# Patient Record
Sex: Female | Born: 1960 | Race: White | Hispanic: No | Smoking: Current every day smoker
Health system: Southern US, Community
[De-identification: ages and names within clinical notes are randomized; demographics above are authoritative.]

## PROBLEM LIST (undated history)

## (undated) DIAGNOSIS — F419 Anxiety disorder, unspecified: Secondary | ICD-10-CM

## (undated) DIAGNOSIS — I10 Essential (primary) hypertension: Secondary | ICD-10-CM

## (undated) DIAGNOSIS — B977 Papillomavirus as the cause of diseases classified elsewhere: Secondary | ICD-10-CM

## (undated) DIAGNOSIS — Z8619 Personal history of other infectious and parasitic diseases: Secondary | ICD-10-CM

## (undated) DIAGNOSIS — IMO0002 Reserved for concepts with insufficient information to code with codable children: Secondary | ICD-10-CM

## (undated) DIAGNOSIS — M1990 Unspecified osteoarthritis, unspecified site: Secondary | ICD-10-CM

## (undated) DIAGNOSIS — K219 Gastro-esophageal reflux disease without esophagitis: Secondary | ICD-10-CM

## (undated) HISTORY — PX: ANKLE FRACTURE SURGERY: SHX122

## (undated) HISTORY — DX: Unspecified osteoarthritis, unspecified site: M19.90

## (undated) HISTORY — PX: FOOT FRACTURE SURGERY: SHX645

## (undated) HISTORY — DX: Personal history of other infectious and parasitic diseases: Z86.19

## (undated) HISTORY — DX: Papillomavirus as the cause of diseases classified elsewhere: B97.7

## (undated) HISTORY — DX: Anxiety disorder, unspecified: F41.9

## (undated) HISTORY — DX: Essential (primary) hypertension: I10

## (undated) HISTORY — DX: Gastro-esophageal reflux disease without esophagitis: K21.9

## (undated) HISTORY — DX: Reserved for concepts with insufficient information to code with codable children: IMO0002

---

## 1980-01-09 HISTORY — PX: MANDIBLE OSTEOTOMY: SHX1007

## 1995-01-09 HISTORY — PX: BREAST BIOPSY: SHX20

## 1996-01-09 HISTORY — PX: CHOLECYSTECTOMY: SHX55

## 1999-03-29 ENCOUNTER — Other Ambulatory Visit: Admission: RE | Admit: 1999-03-29 | Discharge: 1999-03-29 | Payer: Self-pay | Admitting: Obstetrics and Gynecology

## 1999-12-08 ENCOUNTER — Emergency Department (HOSPITAL_COMMUNITY): Admission: EM | Admit: 1999-12-08 | Discharge: 1999-12-08 | Payer: Self-pay | Admitting: Emergency Medicine

## 2000-05-03 ENCOUNTER — Other Ambulatory Visit: Admission: RE | Admit: 2000-05-03 | Discharge: 2000-05-03 | Payer: Self-pay | Admitting: Obstetrics and Gynecology

## 2001-05-16 ENCOUNTER — Other Ambulatory Visit: Admission: RE | Admit: 2001-05-16 | Discharge: 2001-05-16 | Payer: Self-pay | Admitting: Obstetrics and Gynecology

## 2002-06-24 ENCOUNTER — Other Ambulatory Visit: Admission: RE | Admit: 2002-06-24 | Discharge: 2002-06-24 | Payer: Self-pay | Admitting: Obstetrics and Gynecology

## 2003-08-05 ENCOUNTER — Other Ambulatory Visit: Admission: RE | Admit: 2003-08-05 | Discharge: 2003-08-05 | Payer: Self-pay | Admitting: Obstetrics and Gynecology

## 2003-08-11 ENCOUNTER — Encounter: Admission: RE | Admit: 2003-08-11 | Discharge: 2003-08-11 | Payer: Self-pay | Admitting: Obstetrics and Gynecology

## 2004-08-25 ENCOUNTER — Ambulatory Visit: Payer: Self-pay | Admitting: Internal Medicine

## 2004-09-15 ENCOUNTER — Other Ambulatory Visit: Admission: RE | Admit: 2004-09-15 | Discharge: 2004-09-15 | Payer: Self-pay | Admitting: Obstetrics and Gynecology

## 2005-04-27 ENCOUNTER — Ambulatory Visit: Payer: Self-pay | Admitting: Licensed Clinical Social Worker

## 2005-05-02 ENCOUNTER — Ambulatory Visit: Payer: Self-pay | Admitting: Licensed Clinical Social Worker

## 2005-05-11 ENCOUNTER — Ambulatory Visit: Payer: Self-pay | Admitting: Licensed Clinical Social Worker

## 2005-06-01 ENCOUNTER — Ambulatory Visit: Payer: Self-pay | Admitting: Licensed Clinical Social Worker

## 2005-06-08 ENCOUNTER — Ambulatory Visit: Payer: Self-pay | Admitting: Licensed Clinical Social Worker

## 2005-06-18 ENCOUNTER — Ambulatory Visit: Payer: Self-pay | Admitting: Licensed Clinical Social Worker

## 2005-06-25 ENCOUNTER — Ambulatory Visit: Payer: Self-pay | Admitting: Internal Medicine

## 2005-09-10 ENCOUNTER — Encounter: Payer: Self-pay | Admitting: Internal Medicine

## 2005-11-02 ENCOUNTER — Ambulatory Visit: Payer: Self-pay | Admitting: Licensed Clinical Social Worker

## 2005-11-23 ENCOUNTER — Ambulatory Visit: Payer: Self-pay | Admitting: Licensed Clinical Social Worker

## 2005-12-10 ENCOUNTER — Encounter: Admission: RE | Admit: 2005-12-10 | Discharge: 2005-12-10 | Payer: Self-pay | Admitting: Obstetrics and Gynecology

## 2006-03-11 ENCOUNTER — Ambulatory Visit: Payer: Self-pay | Admitting: Internal Medicine

## 2006-07-09 ENCOUNTER — Encounter: Admission: RE | Admit: 2006-07-09 | Discharge: 2006-07-09 | Payer: Self-pay | Admitting: Obstetrics and Gynecology

## 2006-10-02 ENCOUNTER — Ambulatory Visit: Payer: Self-pay | Admitting: Internal Medicine

## 2006-10-02 ENCOUNTER — Encounter: Payer: Self-pay | Admitting: Internal Medicine

## 2006-10-02 DIAGNOSIS — D239 Other benign neoplasm of skin, unspecified: Secondary | ICD-10-CM | POA: Insufficient documentation

## 2006-10-03 DIAGNOSIS — F419 Anxiety disorder, unspecified: Secondary | ICD-10-CM

## 2006-10-03 DIAGNOSIS — F329 Major depressive disorder, single episode, unspecified: Secondary | ICD-10-CM

## 2006-10-03 DIAGNOSIS — M545 Low back pain, unspecified: Secondary | ICD-10-CM | POA: Insufficient documentation

## 2006-11-04 ENCOUNTER — Encounter: Payer: Self-pay | Admitting: Internal Medicine

## 2006-11-04 ENCOUNTER — Ambulatory Visit: Payer: Self-pay | Admitting: Internal Medicine

## 2006-11-11 ENCOUNTER — Ambulatory Visit: Payer: Self-pay | Admitting: Internal Medicine

## 2006-11-12 ENCOUNTER — Encounter: Payer: Self-pay | Admitting: Internal Medicine

## 2006-11-15 ENCOUNTER — Ambulatory Visit: Payer: Self-pay | Admitting: Internal Medicine

## 2006-11-21 ENCOUNTER — Ambulatory Visit: Payer: Self-pay | Admitting: Internal Medicine

## 2006-11-21 ENCOUNTER — Encounter (INDEPENDENT_AMBULATORY_CARE_PROVIDER_SITE_OTHER): Payer: Self-pay | Admitting: *Deleted

## 2007-01-31 ENCOUNTER — Ambulatory Visit: Payer: Self-pay | Admitting: Psychology

## 2007-02-04 ENCOUNTER — Encounter: Admission: RE | Admit: 2007-02-04 | Discharge: 2007-02-04 | Payer: Self-pay | Admitting: Obstetrics and Gynecology

## 2007-02-13 ENCOUNTER — Ambulatory Visit: Payer: Self-pay | Admitting: Psychology

## 2007-02-21 ENCOUNTER — Ambulatory Visit: Payer: Self-pay | Admitting: Psychology

## 2007-03-14 ENCOUNTER — Ambulatory Visit: Payer: Self-pay | Admitting: Psychology

## 2007-03-28 ENCOUNTER — Ambulatory Visit: Payer: Self-pay | Admitting: Psychology

## 2007-04-18 ENCOUNTER — Ambulatory Visit: Payer: Self-pay | Admitting: Psychology

## 2007-05-16 ENCOUNTER — Ambulatory Visit: Payer: Self-pay | Admitting: Psychology

## 2007-06-24 ENCOUNTER — Ambulatory Visit: Payer: Self-pay | Admitting: Psychology

## 2007-07-08 ENCOUNTER — Telehealth: Payer: Self-pay | Admitting: Internal Medicine

## 2007-08-04 ENCOUNTER — Ambulatory Visit: Payer: Self-pay | Admitting: Psychology

## 2007-09-02 ENCOUNTER — Ambulatory Visit: Payer: Self-pay | Admitting: Psychology

## 2007-09-26 ENCOUNTER — Ambulatory Visit: Payer: Self-pay | Admitting: Psychology

## 2007-10-24 ENCOUNTER — Ambulatory Visit: Payer: Self-pay | Admitting: Psychology

## 2007-11-21 ENCOUNTER — Ambulatory Visit: Payer: Self-pay | Admitting: Psychology

## 2007-12-10 ENCOUNTER — Telehealth: Payer: Self-pay | Admitting: Internal Medicine

## 2008-01-16 ENCOUNTER — Ambulatory Visit: Payer: Self-pay | Admitting: Psychology

## 2008-04-08 ENCOUNTER — Ambulatory Visit: Payer: Self-pay | Admitting: Psychology

## 2008-06-04 ENCOUNTER — Ambulatory Visit: Payer: Self-pay | Admitting: Psychology

## 2008-07-09 ENCOUNTER — Telehealth: Payer: Self-pay | Admitting: Internal Medicine

## 2008-08-03 ENCOUNTER — Ambulatory Visit: Payer: Self-pay | Admitting: Psychology

## 2009-03-15 ENCOUNTER — Telehealth: Payer: Self-pay | Admitting: Internal Medicine

## 2009-04-08 ENCOUNTER — Encounter: Payer: Self-pay | Admitting: Internal Medicine

## 2009-04-08 LAB — CONVERTED CEMR LAB: Pap Smear: NORMAL

## 2009-06-27 ENCOUNTER — Telehealth: Payer: Self-pay | Admitting: Internal Medicine

## 2009-06-29 ENCOUNTER — Ambulatory Visit: Payer: Self-pay | Admitting: Internal Medicine

## 2009-07-14 ENCOUNTER — Ambulatory Visit: Payer: Self-pay | Admitting: Psychology

## 2009-07-19 ENCOUNTER — Ambulatory Visit: Payer: Self-pay | Admitting: Internal Medicine

## 2009-07-19 DIAGNOSIS — F458 Other somatoform disorders: Secondary | ICD-10-CM

## 2009-07-25 ENCOUNTER — Encounter: Payer: Self-pay | Admitting: Internal Medicine

## 2009-07-28 ENCOUNTER — Ambulatory Visit: Payer: Self-pay | Admitting: Psychology

## 2009-08-12 ENCOUNTER — Ambulatory Visit: Payer: Self-pay | Admitting: Internal Medicine

## 2009-08-12 ENCOUNTER — Ambulatory Visit: Payer: Self-pay | Admitting: Psychology

## 2009-08-12 LAB — CONVERTED CEMR LAB
ALT: 12 units/L (ref 0–35)
AST: 16 units/L (ref 0–37)
Alkaline Phosphatase: 42 units/L (ref 39–117)
Basophils Absolute: 0 10*3/uL (ref 0.0–0.1)
Bilirubin Urine: NEGATIVE
Bilirubin, Direct: 0.1 mg/dL (ref 0.0–0.3)
CO2: 30 meq/L (ref 19–32)
Calcium: 9.1 mg/dL (ref 8.4–10.5)
Chloride: 106 meq/L (ref 96–112)
Eosinophils Relative: 1.9 % (ref 0.0–5.0)
Glucose, Bld: 81 mg/dL (ref 70–99)
HCT: 38.5 % (ref 36.0–46.0)
HDL: 70.5 mg/dL (ref >39.00)
Ketones, ur: NEGATIVE mg/dL
LDL Cholesterol: 102 mg/dL — ABNORMAL HIGH (ref 0–99)
Leukocytes, UA: NEGATIVE
Lymphocytes Relative: 26.5 % (ref 12.0–46.0)
Monocytes Relative: 6.4 % (ref 3.0–12.0)
Neutrophils Relative %: 64.5 % (ref 43.0–77.0)
Platelets: 226 10*3/uL (ref 150.0–400.0)
RDW: 12.4 % (ref 11.5–14.6)
Sodium: 141 meq/L (ref 135–145)
Specific Gravity, Urine: 1.02 (ref 1.000–1.030)
Total Bilirubin: 0.8 mg/dL (ref 0.3–1.2)
Total CHOL/HDL Ratio: 3
Total Protein, Urine: NEGATIVE mg/dL
Vitamin B-12: 159 pg/mL — ABNORMAL LOW (ref 211–911)
WBC: 4.8 10*3/uL (ref 4.5–10.5)
pH: 7 (ref 5.0–8.0)

## 2009-08-18 ENCOUNTER — Ambulatory Visit: Payer: Self-pay | Admitting: Internal Medicine

## 2009-08-18 DIAGNOSIS — E538 Deficiency of other specified B group vitamins: Secondary | ICD-10-CM | POA: Insufficient documentation

## 2009-12-30 ENCOUNTER — Telehealth: Payer: Self-pay | Admitting: Internal Medicine

## 2010-02-08 ENCOUNTER — Telehealth: Payer: Self-pay | Admitting: Internal Medicine

## 2010-02-09 NOTE — Progress Notes (Signed)
  Phone Note Refill Request Message from:  Fax from Pharmacy on March 15, 2009 4:07 PM  Refills Requested: Medication #1:  ALPRAZOLAM 0.25 MG  TABS q 6 as needed [facility] [address], please Advise refill  Initial call taken by: Ami Bullins CMA,  March 15, 2009 4:07 PM  Follow-up for Phone Call        I dont feel comfortable with full rx as this pt has not been seen in the office since nov 2008  ok for limited rx only;  please make return OV with Dr Debby Bud for further refills Follow-up by: Corwin Levins MD,  March 15, 2009 5:03 PM  Additional Follow-up for Phone Call Additional follow up Details #1::        rx faxed to pharmacy Additional Follow-up by: Margaret Pyle, CMA,  March 16, 2009 8:07 AM    New/Updated Medications: ALPRAZOLAM 0.25 MG  TABS (ALPRAZOLAM) q 6 as needed Prescriptions: ALPRAZOLAM 0.25 MG  TABS (ALPRAZOLAM) q 6 as needed  #40 x 0   Entered and Authorized by:   Corwin Levins MD   Signed by:   Corwin Levins MD on 03/15/2009   Method used:   Print then Give to Patient   RxID:   [id number]

## 2010-02-09 NOTE — Assessment & Plan Note (Signed)
Summary: CPX /NWS  #   Vital Signs:  Patient profile:   50 year old female Height:      65 inches (165.10 cm) Weight:      132.50 pounds (60.23 kg) BMI:     22.13 O2 Sat:      98 % on Room air Temp:     98.7 degrees F (37.06 degrees C) oral Pulse rate:   90 / minute BP sitting:   104 / 72  (left arm) Cuff size:   regular  Vitals Entered By: Anna Crawford CMA (August 18, 2009 1:47 PM)  O2 Flow:  Room air CC: CPX./kb Is Patient Diabetic? No Pain Assessment Patient in pain? no      Comments No refills needed at this time./kb   Primary Care Provider:  Norins  CC:  CPX./kb.  History of Present Illness: Anna Crawford presents for a general wellness exam. She was recently seen for situational anxiety and reports that she has seen an improvement with medication. She has tolerated it well. She has ha several productive sessions with Dr. Dellia Cloud.  She was having a problem with bruxism at her last visit and was started on cyclobenzaprine. She reports that she is really sleeping well which helps the "clenching" and also may contribute to the improvement in mood.  In the Interval she did have colonoscopy July 18th with no significant polyps. She is for repeat exam in 5 years.  She has seen her Gyn and is current with PAP testing and with breast exams/mammography. Reports not available.  Preventive Screening-Counseling & Management  Alcohol-Tobacco     Smoking Status: never  Caffeine-Diet-Exercise     Diet Comments: healthy diet     Does Patient Exercise: yes     Exercise (avg: min/session): intermittent  Hep-HIV-STD-Contraception     SBE monthly: yes  Safety-Violence-Falls     Seat Belt Use: yes     Fall Risk: none  Current Medications (verified): 1)  Vitamin B Complex   Caps (B Complex Vitamins) .... Once Daily 2)  Valtrex 500 Mg  Tabs (Valacyclovir Hcl) .... As Needed Fever Sores 3)  Alprazolam 0.25 Mg  Tabs (Alprazolam) .... Q 6 As Needed 4)  Ibuprofen 200 Mg  Caps  (Ibuprofen) .Marland Kitchen.. 1 or 2 Q6 As Needed 5)  Calcium 700mg  + Vitamin A, D, & K .... 1 By Mouth Once Daily 6)  Paxil Cr 12.5 Mg Xr24h-Tab (Paroxetine Hcl) .Marland Kitchen.. 1 By Mouth Once Daily 7)  Cyclobenzaprine Hcl 5 Mg Tabs (Cyclobenzaprine Hcl) .Marland Kitchen.. 1 By Mouth At Bedtime.  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  Physical Exam  General:  Well-developed,well-nourished,in no acute distress; alert,appropriate and cooperative throughout examination Head:  Normocephalic and atraumatic without obvious abnormalities. No apparent alopecia or balding. Eyes:  No corneal or conjunctival inflammation noted. EOMI. Perrla. Funduscopic exam benign, without hemorrhages, exudates or papilledema. Vision grossly normal. Ears:  External ear exam shows no significant lesions or deformities.  Otoscopic examination reveals clear canals, tympanic membranes are intact bilaterally  without bulging, retraction, inflammation or discharge. Hearing is grossly normal bilaterally. Nose:  no external deformity and no external erythema.   Mouth:  Oral mucosa and oropharynx without lesions or exudates.  Teeth in good repair. Neck:  supple, full ROM, no thyromegaly, and no carotid bruits.   Chest Wall:  No deformities, masses, or tenderness noted. Breasts:  deferred to gyn Lungs:  Normal respiratory effort, chest expands symmetrically. Lungs are clear to auscultation, no crackles or wheezes. Heart:  Normal rate and regular rhythm. S1 and S2 normal without gallop, murmur, click, rub or other extra sounds. Abdomen:  soft, non-tender, normal bowel sounds, no guarding, and no hepatomegaly.   Genitalia:  deferred to gyn Msk:  No deformity or scoliosis noted of thoracic or lumbar spine.   Pulses:  2+ radial and DP pulses Extremities:  No clubbing, cyanosis, edema, or deformity noted with normal full range of motion of all joints.   Neurologic:  alert & oriented X3, cranial nerves II-XII intact, strength normal in all extremities, gait normal, and DTRs symmetrical and normal.  No paresthesia noted. Skin:  turgor normal, color normal, no suspicious lesions, and no ulcerations.  Well healed scar medial left ankle from ORIF Cervical Nodes:  no anterior cervical adenopathy and no posterior cervical adenopathy.   Psych:  Oriented X3, normally interactive, and good eye contact.     Impression & Recommendations:  Problem # 1:  VITAMIN B12 DEFICIENCY (ICD-266.2) Serum B12 level 159. Discussed replacement options: monthly injections vs nasal spray.   Plan - Nascobal 1 spray to nostril once a week.            F/u B12 level in 6 months  Problem # 2:  BRUXISM (ICD-306.8) Doing better with flexeril. Dentist was to have fit her with a bite block.  Problem # 3:  ANXIETY (ICD-300.00) Doing very well on present regimen.   Plan - continue Paxil for 6 months and reassess.  Her updated  medication list for this problem includes:    Alprazolam 0.25 Mg Tabs (Alprazolam) ..... Q 6 as needed    Paxil Cr 12.5 Mg Xr24h-tab (Paroxetine hcl) .Marland Kitchen... 1 by mouth once daily  Problem # 4:  Preventive Health Care (ICD-V70.0) Inteval history as noted. Physical exam is normal. lab results at normal with an excellent lipid profile with a Framingham Data based 10 year cardiac risk of less than 1%. Serum glucose was normal. She is current with her gynecologist. She is current with colorectal cancer screening. She is a candidate for tetnus booster if she hasn't had one in last 10 years.  She is advised that aside from routine gynecologic exams she doesn't need a routine physical or lab work for at least 3 years.  In summary - a delightful woman who appears to be medically stable and generally in good health. She will return in 5-6 months for a med check and follow-up of B12 deficiency.   Complete Medication List: 1)  Vitamin B Complex Caps (B complex vitamins) .... Once daily 2)  Valtrex 500 Mg Tabs (Valacyclovir hcl) .... As  needed fever sores 3)  Alprazolam 0.25 Mg Tabs (Alprazolam) .... Q 6 as needed 4)  Ibuprofen 200 Mg Caps (Ibuprofen) .Marland Kitchen.. 1 or 2 q6 as needed 5)  Calcium 700mg  + Vitamin A, D, & K  .... 1 by mouth once daily 6)  Paxil Cr 12.5 Mg Xr24h-tab (Paroxetine hcl) .Marland Kitchen.. 1 by mouth once daily 7)  Cyclobenzaprine Hcl 5 Mg Tabs (Cyclobenzaprine hcl) .Marland Kitchen.. 1 by mouth at bedtime. 8)  Nascobal 500 Mcg/0.44ml Soln (Cyanocobalamin) .Marland Kitchen.. 1 spray to one nostril once a week   Patient: Anna Crawford Note: All result statuses are Final unless otherwise noted.  Tests: (1) BMP (METABOL)   Sodium                    141 mEq/L                   135-145   Potassium                 4.6 mEq/L                   3.5-5.1   Chloride                  106 mEq/L                   96-112   Carbon Dioxide            30 mEq/L                    19-32   Glucose                   81 mg/dL                     16-10   BUN                       11 mg/dL                    9-60   Creatinine                0.6 mg/dL                   4.5-4.0   Calcium                   9.1 mg/dL                   9.8-11.9   GFR                       124.20 mL/min               >60  Tests: (2) Lipid Panel (LIPID)   Cholesterol               184 mg/dL                   1-478     ATP III Classification            Desirable:  < 200 mg/dL                    Borderline High:  200 - 239 mg/dL               High:  > = 240 mg/dL  Triglycerides             58.0 mg/dL                  4.7-829.5     Normal:  <150 mg/dL     Borderline High:  621 - 199 mg/dL   HDL                       30.86 mg/dL                 >57.84   VLDL Cholesterol          11.6 mg/dL                  6.9-62.9   LDL Cholesterol      [H]  528 mg/dL                   4-13  CHO/HDL Ratio:  CHD Risk                             3                    Men          Women     1/2 Average Risk     3.4          3.3     Average Risk          5.0          4.4     2X Average Risk          9.6          7.1     3X Average Risk          15.0          11.0                           Tests: (3) CBC Platelet w/Diff (CBCD)   White Cell Count          4.8 K/uL                    4.5-10.5   Red Cell Count            3.93 Mil/uL                 3.87-5.11   Hemoglobin                13.4 g/dL                   24.4-01.0   Hematocrit                38.5 %                      36.0-46.0   MCV                       97.9 fl                     78.0-100.0   MCHC                      34.8 g/dL  30.0-36.0   RDW                       12.4 %                      11.5-14.6   Platelet Count            226.0 K/uL                  150.0-400.0   Neutrophil %              64.5 %                      43.0-77.0   Lymphocyte %              26.5 %                      12.0-46.0   Monocyte %                6.4 %                       3.0-12.0   Eosinophils%               1.9 %                       0.0-5.0   Basophils %               0.7 %                       0.0-3.0   Neutrophill Absolute      3.1 K/uL                    1.4-7.7   Lymphocyte Absolute       1.3 K/uL                    0.7-4.0   Monocyte Absolute         0.3 K/uL                    0.1-1.0  Eosinophils, Absolute                             0.1 K/uL                    0.0-0.7   Basophils Absolute        0.0 K/uL                    0.0-0.1  Tests: (4) Hepatic/Liver Function Panel (HEPATIC)   Total Bilirubin           0.8 mg/dL                   1.6-1.0   Direct Bilirubin          0.1 mg/dL                   9.6-0.4   Alkaline Phosphatase      42 U/L                      39-117   AST  16 U/L                      0-37   ALT                       12 U/L                      0-35   Total Protein             6.6 g/dL                    8.1-1.9   Albumin                   4.1 g/dL                    1.4-7.8  Tests: (5) TSH (TSH)   FastTSH                   1.24 uIU/mL                 0.35-5.50  Tests: (6) UDip Only (UDIP)   Color                     YELLOW       RANGE:  Yellow;Lt. Yellow   Clarity                   CLEAR                       Clear   Specific Gravity          1.020                       1.000 - 1.030   Urine Ph                  7.0                         5.0-8.0   Protein                   NEGATIVE                    Negative   Urine Glucose             NEGATIVE                    Negative   Ketones                   NEGATIVE                    Negative   Urine Bilirubin           NEGATIVE                    Negative   Blood                     NEGATIVE                    Negative   Urobilinogen              0.2  0.0 - 1.0   Leukocyte Esterace        NEGATIVE                    Negative   Nitrite                   NEGATIVE                    Negative  Tests: (7) T4, Free (FT4R)   Free T4                   0.78 ng/dL                   0.60-1.60  Tests: (8) B12 Serum - Total ONLY (B12)   Vitamin B12          [L]  159 pg/mL                   211-911Prescriptions: NASCOBAL 500 MCG/0.1ML SOLN (CYANOCOBALAMIN) 1 spray to one nostril once a week  #2.6ml x 2   Entered and Authorized by:   Jacques Navy MD   Signed by:   Jacques Navy MD on 08/18/2009   Method used:   [facility]        [phone]       [address]       [place], [place]  [address]       Ph: [phone]       Fax: [phone]   RxID:   [id number]   Preventive Care Screening  Colonoscopy:    Date:  07/25/2009    Results:  normal     Preventive Care Screening  Colonoscopy:    Date:  07/25/2009    Results:  normal

## 2010-02-09 NOTE — Assessment & Plan Note (Signed)
Summary: FU ON MEDS/NWS   #   Vital Signs:  Patient profile:   50 year old female Height:      65 inches Weight:      129 pounds BMI:     21.54 O2 Sat:      98 % on Room air Temp:     98.4 degrees F oral Pulse rate:   76 / minute BP sitting:   128 / 80  (left arm) Cuff size:   regular  Vitals Entered By: Bill Salinas CMA (July 19, 2009 10:47 AM)  O2 Flow:  Room air CC: pt here for f/u on Paxil/ ab   Primary Care Provider:  Lonni Dirden  CC:  pt here for f/u on Paxil/ ab.  History of Present Illness: medication follow-up. She is doing better with paxil CR: smoother and calmer, fewer ups and downs. She has seen Dr. Dellia Cloud, which was helpful, and will be seeing him every two weeks for a while.   She does report that she is clenching at night and subsequently having tension and discomfort in the neck. Her dentist has seens signs of bruxism.   Current Medications (verified): 1)  Vitamin B Complex   Caps (B Complex Vitamins) .... Once Daily 2)  Valtrex 500 Mg  Tabs (Valacyclovir Hcl) .... As Needed Fever Sores 3)  Alprazolam 0.25 Mg  Tabs (Alprazolam) .... Q 6 As Needed 4)  Ibuprofen 200 Mg  Caps (Ibuprofen) .Marland Kitchen.. 1 or 2 Q6 As Needed 5)  Calcium 700mg  + Vitamin A, D, & K .... 1 By Mouth Once Daily 6)  Paxil Cr 12.5 Mg Xr24h-Tab (Paroxetine Hcl) .Marland Kitchen.. 1 By Mouth Once Daily  Allergies (verified): No Known Drug Allergies PMH-FH-SH reviewed-no changes except otherwise noted  Review of Systems  The patient denies anorexia, fever, weight loss, weight gain, chest pain, dyspnea on exertion, abdominal pain, muscle weakness, abnormal bleeding, and enlarged lymph nodes.    Physical Exam  General:  Well-developed,well-nourished,in no acute distress; alert,appropriate and cooperative throughout examination Eyes:  pupils equal and pupils round.   Lungs:  normal respiratory effort.   Heart:  normal rate and regular rhythm.   Psych:  Oriented X3, normally interactive, good eye contact, and  not anxious appearing.     Impression & Recommendations:  Problem # 1:  ANXIETY (ICD-300.00) Doing better with Paxil CR and return to Dr. Dellia Cloud  Plan - will continue paxil CR 3-6 months and reassess.  Her updated medication list for this problem includes:    Alprazolam 0.25 Mg Tabs (Alprazolam) ..... Q 6 as needed    Paxil Cr 12.5 Mg Xr24h-tab (Paroxetine hcl) .Marland Kitchen... 1 by mouth once daily  Problem # 2:  BRUXISM (ICD-306.8) Patient admits to nocturnal "clenching" of teeth/jaw with subsequent neck discomfort. Her dentist has seen signs of bruxism  Plan - cyclobenzaprine 5mg  at bedtime          bite guard - may want to try otc atheltic mouth guard first.  Complete Medication List: 1)  Vitamin B Complex Caps (B complex vitamins) .... Once daily 2)  Valtrex 500 Mg Tabs (Valacyclovir hcl) .... As needed fever sores 3)  Alprazolam 0.25 Mg Tabs (Alprazolam) .... Q 6 as needed 4)  Ibuprofen 200 Mg Caps (Ibuprofen) .Marland Kitchen.. 1 or 2 q6 as needed 5)  Calcium 700mg  + Vitamin A, D, & K  .... 1 by mouth once daily 6)  Paxil Cr 12.5 Mg Xr24h-tab (Paroxetine hcl) .Marland Kitchen.. 1 by mouth once daily 7)  Cyclobenzaprine Hcl 5 Mg Tabs (Cyclobenzaprine hcl) .Marland Kitchen.. 1 by mouth at bedtime. Prescriptions: CYCLOBENZAPRINE HCL 5 MG TABS (CYCLOBENZAPRINE HCL) 1 by mouth at bedtime.  #30 x 5   Entered and Authorized by:   Jacques Navy MD   Signed by:   Jacques Navy MD on 07/19/2009   Method used:   [facility]ronically to        [facility] [phone]* (retail)       [address]       [place], [place]  [address]       Ph: [phone]       Fax: [phone]   RxID:   [id number]

## 2010-02-09 NOTE — Progress Notes (Signed)
  Phone Note Refill Request Message from:  Fax from Pharmacy on December 30, 2009 11:36 AM  Refills Requested: Medication #1:  ALPRAZOLAM 0.25 MG  TABS q 6 as needed please Advise refills, [facility] [address]  Initial call taken by: Ami Bullins CMA,  December 30, 2009 11:37 AM  Follow-up for Phone Call        prescription faxed to [facility] Follow-up by: Ami Bullins CMA,  December 30, 2009 1:44 PM    New/Updated Medications: ALPRAZOLAM 0.25 MG  TABS (ALPRAZOLAM) 1 by mouth two times a day as needed Prescriptions: ALPRAZOLAM 0.25 MG  TABS (ALPRAZOLAM) 1 by mouth two times a day as needed  #40 x 5   Entered and Authorized by:   Corwin Levins MD   Signed by:   Corwin Levins MD on 12/30/2009   Method used:   Print then Give to Patient   RxID:   [id number]  done hardcopy to LIM side B - dahlia Corwin Levins MD  December 30, 2009 11:52 AM

## 2010-02-09 NOTE — Progress Notes (Signed)
Summary: REFILL Anna Crawford  Phone Note Refill Request Message from:  Patient  Refills Requested: Medication #1:  ALPRAZOLAM 0.25 MG  TABS q 6 as needed  [facility] [place]  Initial call taken by: Lamar Sprinkles, CMA,  June 27, 2009 10:32 AM  Follow-up for Phone Call        ok for refills x 5 Follow-up by: Jacques Navy MD,  June 27, 2009 6:30 PM    Prescriptions: ALPRAZOLAM 0.25 MG  TABS (ALPRAZOLAM) q 6 as needed  #40 x 5   Entered by:   Ami Bullins CMA   Authorized by:   Jacques Navy MD   Signed by:   Bill Salinas CMA on 06/28/2009   Method used:   Telephoned to ...       [facility] [phone]* (retail)       [address]       [place], [place]  [address]       Ph: [phone]       Fax: [phone]   RxID:   [id number]

## 2010-02-09 NOTE — Procedures (Signed)
Summary: Colonoscopy/[facility]  Colonoscopy/[facility]   Imported By: Sherian Rein 08/02/2009 09:22:24  _____________________________________________________________________  External Attachment:    Type:   Image     Comment:   External Document

## 2010-02-09 NOTE — Assessment & Plan Note (Signed)
Summary: ear achem,ringing in ears/sob x2 mos/req this day/#/cd   Vital Signs:  Patient profile:   50 year old female Height:      65 inches Weight:      132 pounds BMI:     22.05 O2 Sat:      96 % on Room air Temp:     99.2 degrees F oral Pulse rate:   94 / minute BP sitting:   120 / 80  (left arm) Cuff size:   regular  Vitals Entered By: Bill Salinas CMA (July 13, 2009 11:08 AM)  O2 Flow:  Room air CC: pt here with c/o ringing in her ears x about 3 weeks. Pt also has colonoscopy sch in July with Dr Bosie Clos at [facility]   Primary Care Provider:  Tarae Wooden  CC:  pt here with c/o ringing in her ears x about 3 weeks. Pt also has colonoscopy sch in July with Dr Bosie Clos at [facility]/ [facility].  History of Present Illness: Presents with a 2-3 wk h/o ringing in her ears. In crowds she has some hearing loss. There is a family h/o woman with hearing loss. She has not had aduiology evaluation.  Patient reports that she is having increased anxiety and increased panic attacks. She also admits to depressive symptoms. She has lost several close family members in the past year. 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 She is a primary helper for her mother, recently widowed and her brother with MR challenges. She is worrying excessively, not sleeping well, forgetful, irritable, anhedonic and has to make herself get up and work every day. She has had counselling with Dr. Dellia Cloud in the past.   Current Medications (verified): 1)  Vitamin B Complex   Caps (B Complex Vitamins) .... Once Daily 2)  Valtrex 500 Mg  Tabs (Valacyclovir Hcl) .... As Needed Fever Sores 3)  Alprazolam 0.25 Mg  Tabs (Alprazolam) .... Q 6 As Needed 4)  Ibuprofen 200 Mg  Caps (Ibuprofen) .Marland Kitchen.. 1 or 2 Q6 As Needed 5)  Calcium 700mg  + Vitamin A, D, & K .... 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  The patient denies anorexia, fever, weight gain, syncope, dyspnea on exertion, headaches, abdominal pain, incontinence, muscle weakness, unusual weight change, and angioedema.         chest tightness she associates with anxiety/panic  Physical Exam  General:  WNWD white female in no distress Head:  Normocephalic and atraumatic without obvious  abnormalities. No apparent alopecia or balding. Eyes:  pupils equal, pupils round, corneas and lenses clear, and no injection.   Ears:  External ear exam shows no significant lesions or deformities.  Otoscopic examination reveals clear canals, tympanic membranes are intact bilaterally without bulging, retraction, inflammation or discharge. Hearing is grossly normal bilaterally. Psych:  Oriented X3, memory intact for recent and remote, normally interactive, good eye contact, and moderately anxious.     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 she is  also missing conversation in crowds. she is concerned with a family h/o woman loosing hearing early  Plan - audiometry  Orders: Audiology (Audio)  Problem # 2:  ANXIETY (ICD-300.00) Multiple factors including grief and loss, stress at work, Company secretary.  Plan - return to Dr. Dellia Cloud for "touch-up."           Paxil CR 12.5 mg once daily           ROV 3-4 wks.   Her updated medication list for this problem includes:    Alprazolam 0.25 Mg Tabs (Alprazolam) ..... Q 6 as needed    Paxil Cr 12.5 Mg Xr24h-tab (Paroxetine hcl) .Marland Kitchen... 1 by mouth once daily  Complete Medication List: 1)  Vitamin B Complex Caps (B complex vitamins) .... Once daily 2)  Valtrex 500 Mg Tabs (Valacyclovir hcl) .... As needed fever sores 3)  Alprazolam 0.25 Mg Tabs (Alprazolam) .... Q 6 as needed 4)  Ibuprofen 200 Mg Caps (Ibuprofen) .Marland Kitchen.. 1 or 2 q6 as needed 5)  Calcium 700mg  + Vitamin A, D, & K  .... 1 by mouth once daily 6)  Paxil Cr 12.5 Mg Xr24h-tab (Paroxetine hcl) .Marland Kitchen.. 1 by mouth once daily Prescriptions: PAXIL CR 12.5 MG XR24H-TAB (PAROXETINE HCL) 1 by mouth once daily Brand medically necessary #30 x 12   Entered and Authorized by:   Jacques Navy MD   Signed by:   Jacques Navy MD on 06/29/2009   Method used:   [facility] to        [facility] [phone][facility] (retail)       [address]       [place], [place]  [address]       Ph: [phone]       Fax: [phone]   RxID:   [id number]   Handout requested.

## 2010-02-15 NOTE — Progress Notes (Signed)
Summary: Paxil  Phone Note From Pharmacy   [facility] [phone][facility] Summary of Call: Lynnette from [facility]. called to inform that Paxil CR 12.5 is unavailable at this time and is on back order. Please advise if Generic is ok? Initial call taken by: Robin Ewing CMA Duncan Dull),  February 08, 2010 1:05 PM  Follow-up for Phone Call        Spoke w/pharmacy. Paxil brand is no longer avail. Pt is agreeable to generic - gave verbal ok to pharm.  Follow-up by: Lamar Sprinkles, CMA,  February 08, 2010 3:29 PM

## 2010-02-20 ENCOUNTER — Telehealth: Payer: Self-pay | Admitting: Internal Medicine

## 2010-02-20 ENCOUNTER — Other Ambulatory Visit: Payer: Self-pay

## 2010-02-21 ENCOUNTER — Encounter (INDEPENDENT_AMBULATORY_CARE_PROVIDER_SITE_OTHER): Payer: Self-pay | Admitting: *Deleted

## 2010-02-21 ENCOUNTER — Other Ambulatory Visit: Payer: BC Managed Care – PPO

## 2010-02-21 ENCOUNTER — Other Ambulatory Visit: Payer: Self-pay | Admitting: Internal Medicine

## 2010-02-21 DIAGNOSIS — E538 Deficiency of other specified B group vitamins: Secondary | ICD-10-CM

## 2010-02-21 LAB — VITAMIN B12: Vitamin B-12: 459 pg/mL (ref 211–911)

## 2010-02-23 ENCOUNTER — Ambulatory Visit (INDEPENDENT_AMBULATORY_CARE_PROVIDER_SITE_OTHER)
Admission: RE | Admit: 2010-02-23 | Discharge: 2010-02-23 | Disposition: A | Discharge status: Home / Self Care | Payer: BC Managed Care – PPO | Source: Ambulatory Visit | Attending: Internal Medicine | Admitting: Internal Medicine

## 2010-02-23 ENCOUNTER — Other Ambulatory Visit: Payer: Self-pay | Admitting: Internal Medicine

## 2010-02-23 ENCOUNTER — Ambulatory Visit (INDEPENDENT_AMBULATORY_CARE_PROVIDER_SITE_OTHER): Payer: BC Managed Care – PPO | Admitting: Internal Medicine

## 2010-02-23 ENCOUNTER — Encounter: Payer: Self-pay | Admitting: Internal Medicine

## 2010-02-23 DIAGNOSIS — R209 Unspecified disturbances of skin sensation: Secondary | ICD-10-CM

## 2010-02-24 ENCOUNTER — Telehealth: Payer: Self-pay | Admitting: Internal Medicine

## 2010-02-27 ENCOUNTER — Encounter: Payer: Self-pay | Admitting: Internal Medicine

## 2010-03-01 NOTE — Progress Notes (Signed)
Summary: Results  Phone Note Outgoing Call   Reason for Call: Discuss lab or test results Summary of Call: Please call patient: c-spine with degenerative disk disease at C5-6 and a likely cause of probelm. Not enough of a problem to warrant MRI or surgical consult. I recommend rfeferral to integrative therapies if she is willing. If her sympotms get worse will get an MRI.  Initial call taken by: Jacques Navy MD,  February 24, 2010 8:52 AM  Follow-up for Phone Call        Pt informed, she would like referral to intergrative therapies please. [facility] notified Follow-up by: Lamar Sprinkles, CMA,  February 24, 2010 5:20 PM  Additional Follow-up for Phone Call Additional follow up Details #1::        k. [facility] notified Additional Follow-up by: Jacques Navy MD,  February 24, 2010 5:33 PM

## 2010-03-01 NOTE — Progress Notes (Signed)
Summary: numbness  Phone Note Call from Patient Call back at [facility] Phone [phone]   Summary of Call: Pt has been using b-12 nasal spray. She has noticed numbness in her index fingers on both sides. Could this be side effect of b-12 deficency? OR side effect of the nasal spray? Does she need office visit for eval?  Initial call taken by: Lamar Sprinkles, CMA,  February 20, 2010 11:42 AM  Follow-up for Phone Call        unlikley to be the spray causing paresthesia. she needs to have a B12 level drawn and an ov.  Follow-up by: Jacques Navy MD,  February 20, 2010 1:08 PM  Additional Follow-up for Phone Call Additional follow up Details #1::        Pt informed, transferred to scheduler for apt.  Additional Follow-up by: Lamar Sprinkles, CMA,  February 20, 2010 3:33 PM

## 2010-03-01 NOTE — Assessment & Plan Note (Signed)
Summary: PER SARAH SCHED FU  STC   Vital Signs:  Patient profile:   50 year old female Height:      63.50 inches Weight:      142.50 pounds BMI:     24.94 O2 Sat:      93 % on Room air Temp:     98.4 degrees F oral Pulse rate:   84 / minute Resp:     14 per minute BP sitting:   128 / 72  (left arm)  Vitals Entered By: Burnard Leigh CMA(AAMA) (February 23, 2010 9:27 AM)  O2 Flow:  Room air CC: Pt here to F/U on numbness & tingling in Index fingers that radiates to other fingers at times.Pt had labs on 02/22/10..sls,cma Is Patient Diabetic? No   Primary Care Provider:  [facility]  CC:  Pt here to F/U on numbness & tingling in Index fingers that radiates to other fingers at times.Pt had labs on 02/22/10..sls and cma.  History of Present Illness: Anna Crawford presents due to paresthesias involving both index fingers. This is intermittent but becoming more frequent. She has a h/o cold hands. She has h/o B12 deficiency and is on replacement. She has had no weakness, no persistent loss of sensation. She does have some neck pain.   Current Medications (verified): 1)  Valtrex 500 Mg  Tabs (Valacyclovir Hcl) .... As Needed Fever Sores 2)  Alprazolam 0.25 Mg  Tabs (Alprazolam) .Marland Kitchen.. 1 By Mouth Two Times A Day As Needed 3)  Ibuprofen 200 Mg  Caps (Ibuprofen) .Marland Kitchen.. 1 or 2 Q6 As Needed 4)  Calcium 700mg  + Vitamin A, D, & K .... 1 By Mouth Once Daily 5)  Paroxetine Hcl 12.5 Mg .Marland Kitchen.. 1 By Mouth Once Daily 6)  Cyclobenzaprine Hcl 5 Mg Tabs (Cyclobenzaprine Hcl) .Marland Kitchen.. 1 By Mouth At Bedtime. 7)  Nascobal 500 Mcg/0.51ml Soln (Cyanocobalamin) .Marland Kitchen.. 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    Physical Exam  General:  Well-developed,well-nourished,in no acute distress; alert,appropriate and cooperative throughout examination Head:  normocephalic and atraumatic.   Eyes:  vision grossly intact, pupils equal, and pupils round.   Neck:  supple and full ROM.   Lungs:  normal respiratory effort.   Heart:  normal rate and regular rhythm.   Msk:  Hands without deformity Pulses:  2+ radial and ulnar pulses. Equal, but slow, capillary refill. Slow circulation with Allen's sign.  Neurologic:  Negative Tinel's and Phalen's sign. Nl two point discrimination at finger tips. Minimally diminished light touch sensation right index finger. Equal sharp point discrimination. Skin:  turgor normal, color normal, and no rashes.   Cervical Nodes:  no anterior cervical adenopathy and no posterior cervical adenopathy.   Psych:  Oriented X3, memory intact for recent and remote, normally interactive, and good eye contact.     Impression & Recommendations:  Problem # 1:  PARESTHESIA, HANDS (ICD-782.0) B12 level in normal range. Thyroid  function from Aug '11 was normal.  Plan - C-spine x-rays r/o foraminal narrowing.           if C-spine is negative the NCS/EMG study.           Continue B12 replacement  Orders: T-Cervical Spine Comp w/Flex & Ext (21308MV)   Clinical Data: Numbness of the fingers, no injury   CERVICAL SPINE COMPLETE WITH FLEXION AND EXTENSION VIEWS   Comparison: None.   Findings: The cervical vertebrae are slightly straightened in alignment.  There is  degenerative disc disease at the C5-6 level with loss of disc space and spurring.  Slight retrolisthesis of C5 on C6 is noted.  No prevertebral soft tissue swelling is seen.  On oblique views no significant foraminal narrowing is seen.  The odontoid process is intact.  The lung apices are clear.   Through flexion and extension there is relatively normal range of motion.  No significant change in the slight retrolisthesis of C5 on C6 is seen.   IMPRESSION:   1.  Degenerative disc disease at C5-6 with 3 mm retrolisthesis of C5 on C6.  Straightened alignment. 2.  Relatively normal range of motion through flexion and extension with no change in the retrolisthesis described above.   Original Report Authenticated By: Juline Patch, M.D.  Plan - with C5-6 DDD will look to Physical therapy as first intervention.           for progressive symptoms will move to MRI to r/o surgical problem.  Complete Medication List: 1)  Valtrex 500 Mg Tabs (Valacyclovir hcl) .... As needed fever sores 2)  Alprazolam 0.25 Mg Tabs (Alprazolam) .Marland Kitchen.. 1 by mouth two times a day as needed 3)  Ibuprofen 200 Mg Caps (Ibuprofen) .Marland Kitchen.. 1 or 2 q6 as needed 4)  Calcium 700mg  + Vitamin A, D, & K  .... 1 by mouth once daily 5)  Paroxetine Hcl 12.5 Mg  .Marland KitchenMarland Kitchen. 1 by mouth once daily 6)  Cyclobenzaprine Hcl 5 Mg Tabs (Cyclobenzaprine hcl) .Marland Kitchen.. 1 by mouth at bedtime. 7)  Nascobal 500 Mcg/0.17ml Soln (Cyanocobalamin) .Marland Kitchen.. 1 spray to one nostril once a week   Orders Added: 1)  T-Cervical Spine Comp w/Flex & Ext [72052TC] 2)  Est. Patient Level IV [78469]   Immunization History:  Influenza Immunization History:    Influenza:  historical (11/08/2009)   Immunization History:  Influenza Immunization History:    Influenza:  Historical (11/08/2009)    Preventive Care Screening  Mammogram:    Date:  04/08/2009    Results:  normal   Pap Smear:    Date:  04/08/2009    Results:  normal

## 2010-03-07 NOTE — Letter (Signed)
   [facility] [address] [place], [place]  [address] Phone: [phone]      February 27, 2010   [facility] [address] [place], [place] [address]  RE:  LAB RESULTS  Dear  Ms. Cedano,  The following is an interpretation of your most recent lab tests.  Please take note of any instructions provided or changes to medications that have resulted from your lab work.   B12 level is in normal range.    Let me know how it goes with integrtive therapies.   Sincerely Yours,    Jacques Navy MD  Appended Document:   Tests: (1) B12 Serum - Total ONLY (B12)   Vitamin B12               459 pg/mL                   211-911

## 2010-07-18 ENCOUNTER — Other Ambulatory Visit: Payer: Self-pay | Admitting: Internal Medicine

## 2010-07-18 NOTE — Telephone Encounter (Signed)
Request for Paxil 12.5mg  [last refill 02/23/10]

## 2010-07-20 NOTE — Telephone Encounter (Signed)
Ok to refill prn.

## 2010-07-28 ENCOUNTER — Telehealth: Payer: Self-pay

## 2010-07-28 MED ORDER — PAROXETINE HCL ER 12.5 MG PO TB24: 12.5000 mg | ORAL_TABLET | ORAL | Status: DC

## 2010-07-28 NOTE — Telephone Encounter (Signed)
rx request for alprazolam 0.25 mg; please advise

## 2010-07-29 NOTE — Telephone Encounter (Signed)
Ok for refill #60 5 refills. Please bring medication list in EPIC up to date. Thanks

## 2010-07-31 MED ORDER — ALPRAZOLAM 0.25 MG PO TABS: 0.2500 mg | ORAL_TABLET | Freq: Two times a day (BID) | ORAL | Status: DC | PRN

## 2010-09-04 ENCOUNTER — Other Ambulatory Visit: Payer: Self-pay | Admitting: Internal Medicine

## 2011-01-05 ENCOUNTER — Other Ambulatory Visit: Payer: Self-pay | Admitting: *Deleted

## 2011-01-05 MED ORDER — PAROXETINE HCL ER 12.5 MG PO TB24: 12.5000 mg | ORAL_TABLET | ORAL | Status: DC

## 2011-01-15 ENCOUNTER — Other Ambulatory Visit: Payer: Self-pay | Admitting: *Deleted

## 2011-01-15 MED ORDER — PAROXETINE HCL ER 12.5 MG PO TB24: 12.5000 mg | ORAL_TABLET | ORAL | Status: DC

## 2011-02-23 ENCOUNTER — Other Ambulatory Visit: Payer: Self-pay

## 2011-02-23 NOTE — Telephone Encounter (Signed)
A user error has taken place: encounter opened in error, closed for administrative reasons.

## 2011-04-03 ENCOUNTER — Other Ambulatory Visit: Payer: Self-pay | Admitting: Internal Medicine

## 2011-04-04 NOTE — Telephone Encounter (Signed)
Alprazolam request [last refill 07.23.12 #60x5] Cyclobenzaprine request [last refill 08.27.12 #30x5] Pt's last OV 02.16.12: no future OV scheduled.

## 2011-04-05 MED ORDER — ALPRAZOLAM 0.25 MG PO TABS: 0.2500 mg | ORAL_TABLET | Freq: Two times a day (BID) | ORAL | Status: DC | PRN

## 2011-04-10 ENCOUNTER — Telehealth: Payer: Self-pay

## 2011-04-10 MED ORDER — PAROXETINE HCL ER 12.5 MG PO TB24: 12.5000 mg | ORAL_TABLET | ORAL | Status: DC

## 2011-04-10 NOTE — Telephone Encounter (Signed)
Patient called requesting status of refills. Returned call to patient and advised called in

## 2011-05-10 ENCOUNTER — Other Ambulatory Visit: Payer: Self-pay | Admitting: Internal Medicine

## 2011-06-10 ENCOUNTER — Other Ambulatory Visit: Payer: Self-pay | Admitting: Internal Medicine

## 2011-06-19 ENCOUNTER — Ambulatory Visit (INDEPENDENT_AMBULATORY_CARE_PROVIDER_SITE_OTHER): Payer: BC Managed Care – PPO | Admitting: Psychology

## 2011-06-19 DIAGNOSIS — F331 Major depressive disorder, recurrent, moderate: Secondary | ICD-10-CM

## 2011-07-03 ENCOUNTER — Ambulatory Visit: Payer: BC Managed Care – PPO | Admitting: Psychology

## 2011-07-17 ENCOUNTER — Ambulatory Visit: Payer: BC Managed Care – PPO | Admitting: Family

## 2011-07-20 ENCOUNTER — Encounter: Payer: Self-pay | Admitting: Family

## 2011-07-20 ENCOUNTER — Ambulatory Visit (INDEPENDENT_AMBULATORY_CARE_PROVIDER_SITE_OTHER): Payer: BC Managed Care – PPO | Admitting: Family

## 2011-07-20 VITALS — BP 112/80 | HR 84 | Temp 97.9°F | Resp 16 | Ht 65.5 in | Wt 162.0 lb

## 2011-07-20 DIAGNOSIS — F411 Generalized anxiety disorder: Secondary | ICD-10-CM

## 2011-07-20 DIAGNOSIS — R635 Abnormal weight gain: Secondary | ICD-10-CM

## 2011-07-20 DIAGNOSIS — M25559 Pain in unspecified hip: Secondary | ICD-10-CM

## 2011-07-20 DIAGNOSIS — M25551 Pain in right hip: Secondary | ICD-10-CM

## 2011-07-20 DIAGNOSIS — R109 Unspecified abdominal pain: Secondary | ICD-10-CM

## 2011-07-20 DIAGNOSIS — R1011 Right upper quadrant pain: Secondary | ICD-10-CM

## 2011-07-20 MED ORDER — ALBUTEROL SULFATE HFA 108 (90 BASE) MCG/ACT IN AERS: 2.0000 | INHALATION_SPRAY | Freq: Four times a day (QID) | RESPIRATORY_TRACT | Status: DC | PRN

## 2011-07-20 MED ORDER — OMEPRAZOLE 40 MG PO CPDR: 40.0000 mg | DELAYED_RELEASE_CAPSULE | Freq: Every day | ORAL | Status: DC

## 2011-07-20 MED ORDER — PAROXETINE HCL ER 25 MG PO TB24: 25.0000 mg | ORAL_TABLET | ORAL | Status: DC

## 2011-07-20 MED ORDER — MELOXICAM 7.5 MG PO TABS: 7.5000 mg | ORAL_TABLET | Freq: Every day | ORAL | Status: DC

## 2011-07-20 NOTE — Patient Instructions (Addendum)
Please schedule a physical. Complete fasting lab work 1 week prior to physical.

## 2011-07-20 NOTE — Progress Notes (Signed)
Subjective:    Patient ID: Anna Crawford, female    DOB: 15-Mar-1960, 51 y.o.   MRN: [id number]  HPI  Anna Crawford is a 51 yr old female who presents today to establish care.  Hip pain-some days, not recently.  Limping, worse the last 2 weeks.  She has taken aleve for 3 days, improved.  Then she returned to work.   Stress- sees Dr. Dellia Cloud.  She was on paxil a few years ago. She uses xanax occasionally at work.  Occasional insomnia.    Rib pain- she reports that pain went away with omeprazole.   Review of Systems  Constitutional: Negative for unexpected weight change.       Has gained 30 pounds over the last 2 yrs  HENT: Negative for congestion.   Respiratory: Negative for cough and shortness of breath.   Cardiovascular: Negative for chest pain and leg swelling.  Gastrointestinal: Negative for nausea, vomiting and diarrhea.  Genitourinary: Negative for dysuria and frequency.  Musculoskeletal: Positive for arthralgias.  Skin: Negative for rash.  Neurological: Negative for headaches.  Hematological: Does not bruise/bleed easily.  Psychiatric/Behavioral:       See HPI   Past Medical History  Diagnosis Date  . Arthritis   . Degenerative disc disease   . History of chicken pox   . Anxiety   . GERD (gastroesophageal reflux disease)     History   Social History  . Marital Status: Divorced    Spouse Name: N/A    Number of Children: 0  . Years of Education: N/A   Occupational History  .  [employer]Duty   Social History Main Topics  . Smoking status: Current Everyday Smoker -- 1.0 packs/day for 18 years    Types: Cigarettes  . Smokeless tobacco: Never Used  . Alcohol Use: Yes     social  . Drug Use: Not on file  . Sexually Active: Not on file   Other Topics Concern  . Not on file   Social History Narrative   Regular exercise:  NoCaffeine Use:  2 cups coffee dailyLives with MomNo childrenCommercial Real Estate    Past Surgical History  Procedure Date  .  Cholecystectomy 1998  . Breast surgery 1997    right breast biopsy--benign  . Mandible osteotomy 1982    Family History  Problem Relation Age of Onset  . Cancer Mother     history of breast  . Arthritis Mother   . Hyperlipidemia Mother   . Cancer Father     lung  . Hypertension Father   . Hyperlipidemia Brother   . Hypertension Brother   . Diabetes Brother   . Thyroid disease Paternal Aunt   . Arthritis Paternal Grandmother   . Thyroid disease Paternal Grandmother     No Known Allergies  Current Outpatient Prescriptions on File Prior to Visit  Medication Sig Dispense Refill  . ALPRAZolam (XANAX) 0.25 MG tablet Take 1 tablet (0.25 mg total) by mouth 2 (two) times daily as needed for anxiety.  60 tablet  5  . cyclobenzaprine (FLEXERIL) 5 MG tablet TAKE 1 TABLET BY MOUTH AT BEDTIME  30 tablet  5  . albuterol (PROVENTIL HFA;VENTOLIN HFA) 108 (90 BASE) MCG/ACT inhaler Inhale 2 puffs into the lungs every 6 (six) hours as needed for wheezing.  1 Inhaler  0  . PARoxetine (PAXIL CR) 25 MG 24 hr tablet Take 1 tablet (25 mg total) by mouth every morning.  30 tablet  2  BP 112/80  Pulse 84  Temp 97.9 F (36.6 C) (Oral)  Resp 16  Ht 5' 5.5" (1.664 m)  Wt 162 lb 0.6 oz (73.501 kg)  BMI 26.55 kg/m2  SpO2 99%       Objective:   Physical Exam  Constitutional: She appears well-developed and well-nourished. No distress.  HENT:  Head: Normocephalic and atraumatic.  Right Ear: Tympanic membrane and ear canal normal.  Left Ear: Tympanic membrane normal.  Mouth/Throat: No oropharyngeal exudate, posterior oropharyngeal edema or posterior oropharyngeal erythema.  Cardiovascular: Normal rate and regular rhythm.   No murmur heard. Pulmonary/Chest: Effort normal and breath sounds normal. No respiratory distress. She has no wheezes. She has no rales. She exhibits no tenderness.  Abdominal: Soft. Bowel sounds are normal. She exhibits no distension and no mass. There is no tenderness.  There is no rebound and no guarding.  Musculoskeletal: She exhibits no edema.  Lymphadenopathy:    She has no cervical adenopathy.  Skin: Skin is warm and dry.  Psychiatric: She has a normal mood and affect. Her behavior is normal. Judgment and thought content normal.          Assessment & Plan:

## 2011-07-23 DIAGNOSIS — M25551 Pain in right hip: Secondary | ICD-10-CM | POA: Insufficient documentation

## 2011-07-23 DIAGNOSIS — R1011 Right upper quadrant pain: Secondary | ICD-10-CM | POA: Insufficient documentation

## 2011-07-23 NOTE — Assessment & Plan Note (Signed)
Recommended short term trial of meloxicam.  If symptoms worsen, or if no improvement, consider referral to sports medicine.

## 2011-07-23 NOTE — Assessment & Plan Note (Signed)
Deteriorated.  Will increase paxil cr from 12.5 to 25mg  daily.

## 2011-07-23 NOTE — Assessment & Plan Note (Signed)
Pt is s/p cholecystectomy. Recommended that she have LFT's drawn today.  She wishes to wait until next week when she completes her fasting blood work.  In the meantime, will start PPI to see if this helps as she has had similar pain in the past which has responded to PPI.

## 2011-07-26 ENCOUNTER — Telehealth: Payer: Self-pay | Admitting: *Deleted

## 2011-07-26 DIAGNOSIS — Z0000 Encounter for general adult medical examination without abnormal findings: Secondary | ICD-10-CM

## 2011-07-26 LAB — CBC WITH DIFFERENTIAL/PLATELET
Basophils Absolute: 0.1 10*3/uL (ref 0.0–0.1)
Basophils Relative: 1 % (ref 0–1)
Hemoglobin: 14.1 g/dL (ref 12.0–15.0)
MCHC: 35.3 g/dL (ref 30.0–36.0)
Neutro Abs: 3.6 10*3/uL (ref 1.7–7.7)
Neutrophils Relative %: 70 % (ref 43–77)
RDW: 12.7 % (ref 11.5–15.5)

## 2011-07-26 NOTE — Telephone Encounter (Signed)
Message copied by Kathi Simpers on Thu Jul 26, 2011  8:32 AM ------      Message from: O'SULLIVAN, MELISSA      Created: Fri Jul 20, 2011  5:04 PM       Pls send lab orders for 1 week:            CBC, BMET, LFT, TSH, UA with reflex micro, FLP (v70)

## 2011-07-26 NOTE — Telephone Encounter (Signed)
Pt presented to the lab for bloodwork listed below. Orders entered and given to the lab.

## 2011-07-27 LAB — HEPATIC FUNCTION PANEL
ALT: 16 U/L (ref 0–35)
AST: 17 U/L (ref 0–37)
Bilirubin, Direct: 0.1 mg/dL (ref 0.0–0.3)
Indirect Bilirubin: 0.2 mg/dL (ref 0.0–0.9)
Total Bilirubin: 0.3 mg/dL (ref 0.3–1.2)

## 2011-07-27 LAB — URINALYSIS, ROUTINE W REFLEX MICROSCOPIC
Hgb urine dipstick: NEGATIVE
Leukocytes, UA: NEGATIVE
Nitrite: NEGATIVE
Protein, ur: NEGATIVE mg/dL

## 2011-07-27 LAB — LIPID PANEL
HDL: 84 mg/dL (ref >39)
LDL Cholesterol: 126 mg/dL — ABNORMAL HIGH (ref 0–99)
Triglycerides: 49 mg/dL (ref <150)
VLDL: 10 mg/dL (ref 0–40)

## 2011-07-27 LAB — BASIC METABOLIC PANEL
BUN: 9 mg/dL (ref 6–23)
Chloride: 107 mEq/L (ref 96–112)
Creat: 0.63 mg/dL (ref 0.50–1.10)
Potassium: 4.7 mEq/L (ref 3.5–5.3)

## 2011-07-27 LAB — URINALYSIS, MICROSCOPIC ONLY

## 2011-07-27 LAB — TSH: TSH: 1.418 u[IU]/mL (ref 0.350–4.500)

## 2011-07-30 ENCOUNTER — Encounter: Payer: Self-pay | Admitting: Family

## 2011-07-30 DIAGNOSIS — E785 Hyperlipidemia, unspecified: Secondary | ICD-10-CM

## 2011-07-31 ENCOUNTER — Ambulatory Visit: Payer: BC Managed Care – PPO | Admitting: Psychology

## 2011-08-06 ENCOUNTER — Encounter: Payer: BC Managed Care – PPO | Admitting: Family

## 2011-08-08 ENCOUNTER — Encounter (HOSPITAL_BASED_OUTPATIENT_CLINIC_OR_DEPARTMENT_OTHER): Payer: Self-pay | Admitting: *Deleted

## 2011-08-08 ENCOUNTER — Observation Stay (HOSPITAL_BASED_OUTPATIENT_CLINIC_OR_DEPARTMENT_OTHER)
Admission: EM | Admit: 2011-08-08 | Discharge: 2011-08-09 | Disposition: A | Discharge status: Home / Self Care | Payer: BC Managed Care – PPO | Attending: Emergency Medicine | Admitting: Emergency Medicine

## 2011-08-08 ENCOUNTER — Emergency Department (HOSPITAL_BASED_OUTPATIENT_CLINIC_OR_DEPARTMENT_OTHER): Payer: BC Managed Care – PPO

## 2011-08-08 DIAGNOSIS — IMO0002 Reserved for concepts with insufficient information to code with codable children: Secondary | ICD-10-CM | POA: Insufficient documentation

## 2011-08-08 DIAGNOSIS — F17200 Nicotine dependence, unspecified, uncomplicated: Secondary | ICD-10-CM | POA: Insufficient documentation

## 2011-08-08 DIAGNOSIS — F411 Generalized anxiety disorder: Secondary | ICD-10-CM | POA: Insufficient documentation

## 2011-08-08 DIAGNOSIS — K219 Gastro-esophageal reflux disease without esophagitis: Secondary | ICD-10-CM | POA: Insufficient documentation

## 2011-08-08 DIAGNOSIS — R079 Chest pain, unspecified: Principal | ICD-10-CM

## 2011-08-08 LAB — URINALYSIS, ROUTINE W REFLEX MICROSCOPIC
Bilirubin Urine: NEGATIVE
Glucose, UA: NEGATIVE mg/dL
Ketones, ur: 15 mg/dL — AB
Nitrite: NEGATIVE
pH: 5.5 (ref 5.0–8.0)

## 2011-08-08 LAB — COMPREHENSIVE METABOLIC PANEL
Alkaline Phosphatase: 67 U/L (ref 39–117)
BUN: 12 mg/dL (ref 6–23)
GFR calc Af Amer: >90 mL/min (ref >90)
Glucose, Bld: 96 mg/dL (ref 70–99)
Potassium: 3.8 mEq/L (ref 3.5–5.1)
Total Protein: 7.9 g/dL (ref 6.0–8.3)

## 2011-08-08 LAB — CBC WITH DIFFERENTIAL/PLATELET
Eosinophils Absolute: 0 10*3/uL (ref 0.0–0.7)
Eosinophils Relative: 1 % (ref 0–5)
HCT: 41.2 % (ref 36.0–46.0)
Hemoglobin: 14.5 g/dL (ref 12.0–15.0)
Lymphs Abs: 1.4 10*3/uL (ref 0.7–4.0)
MCH: 32.3 pg (ref 26.0–34.0)
MCV: 91.8 fL (ref 78.0–100.0)
Monocytes Absolute: 0.4 10*3/uL (ref 0.1–1.0)
Monocytes Relative: 6 % (ref 3–12)
Neutrophils Relative %: 70 % (ref 43–77)
RBC: 4.49 MIL/uL (ref 3.87–5.11)

## 2011-08-08 LAB — CARDIAC PANEL(CRET KIN+CKTOT+MB+TROPI)
Relative Index: INVALID (ref 0.0–2.5)
Total CK: 77 U/L (ref 7–177)

## 2011-08-08 MED ORDER — GI COCKTAIL ~~LOC~~
30.0000 mL | Freq: Once | ORAL | Status: AC
  Administered 2011-08-08: 30 mL via ORAL
  Filled 2011-08-08: qty 30

## 2011-08-08 MED ORDER — ASPIRIN 81 MG PO CHEW
324.0000 mg | CHEWABLE_TABLET | Freq: Once | ORAL | Status: AC
  Administered 2011-08-08: 324 mg via ORAL
  Filled 2011-08-08: qty 4

## 2011-08-08 NOTE — ED Notes (Signed)
Pt. Arrived from [facility] via [facility].  Pt. Had chest pain this am with radiation into bilateral jaw areas.  Upon arrival to CDU 2, pt. Denies any chest pain or sob or n/v.  Skin is warm, pink and dry.  Pt. Is alert and oriented X3.  Will continue to monitor.

## 2011-08-08 NOTE — ED Provider Notes (Signed)
Medical screening examination/treatment/procedure(s) were conducted as a shared visit with non-physician practitioner(s) and myself.  I personally evaluated the patient during the encounter   Glynn Octave, MD 08/08/11 1538

## 2011-08-08 NOTE — ED Notes (Signed)
Patient states she was sitting at home and developed sudden central chest pressure radiating into her right and left jaws.  States she also had some dizziness when standing up.  States she did not feel good 2 days ago and had symptoms of nausea which resolved after 24 hours.

## 2011-08-08 NOTE — ED Provider Notes (Signed)
History     CSN: [id number]  Arrival date & time 08/08/11  1105   First MD Initiated Contact with Patient 08/08/11 1124      Chief Complaint  Patient presents with  . Chest Pain    (Consider location/radiation/quality/duration/timing/severity/associated sxs/prior treatment) HPI Comments: Patient developed substernal chest pressure and aching while sitting at home resting. She has not had pain like this in the past. It radiated to her left jaw in spite of her right jaw. No shortness of breath, vomiting, sweating. She does feel lightheaded with standing. Denies any previous cardiac history. She is an active smoker. Denies any hypertension, hyperlipidemia or diabetes. Denies family history of CAD. She's never had a stress test in the past.  The history is provided by the patient.    Past Medical History  Diagnosis Date  . Arthritis   . Degenerative disc disease   . History of chicken pox   . Anxiety   . GERD (gastroesophageal reflux disease)     Past Surgical History  Procedure Date  . Cholecystectomy 1998  . Breast surgery 1997    right breast biopsy--benign  . Mandible osteotomy 1982  . Ankle fracture surgery     Family History  Problem Relation Age of Onset  . Cancer Mother     history of breast  . Arthritis Mother   . Hyperlipidemia Mother   . Cancer Father     lung  . Hypertension Father   . Hyperlipidemia Brother   . Hypertension Brother   . Diabetes Brother   . Thyroid disease Paternal Aunt   . Arthritis Paternal Grandmother   . Thyroid disease Paternal Grandmother     History  Substance Use Topics  . Smoking status: Current Everyday Smoker -- 1.0 packs/day for 18 years    Types: Cigarettes  . Smokeless tobacco: Never Used  . Alcohol Use: Yes     social    OB History    Grav Para Term Preterm Abortions TAB SAB Ect Mult Living                  Review of Systems  Constitutional: Negative for fever, activity change and appetite change.  HENT:  Negative for congestion and rhinorrhea.   Respiratory: Positive for chest tightness. Negative for cough and shortness of breath.   Cardiovascular: Positive for chest pain.  Gastrointestinal: Positive for nausea. Negative for vomiting and abdominal pain.  Genitourinary: Negative for dysuria and hematuria.  Musculoskeletal: Negative for back pain.  Neurological: Positive for dizziness and light-headedness. Negative for syncope.    Allergies  Review of patient's allergies indicates no known allergies.  Home Medications   Current Outpatient Rx  Name Route Sig Dispense Refill  . IBUPROFEN 400 MG PO TABS Oral Take 400 mg by mouth every 6 (six) hours as needed.    . ALBUTEROL SULFATE HFA 108 (90 BASE) MCG/ACT IN AERS Inhalation Inhale 2 puffs into the lungs every 6 (six) hours as needed for wheezing. 1 Inhaler 0  . ALPRAZOLAM 0.25 MG PO TABS Oral Take 1 tablet (0.25 mg total) by mouth 2 (two) times daily as needed for anxiety. 60 tablet 5  . CYCLOBENZAPRINE HCL 5 MG PO TABS  TAKE 1 TABLET BY MOUTH AT BEDTIME 30 tablet 5  . MELOXICAM 7.5 MG PO TABS Oral Take 1 tablet (7.5 mg total) by mouth daily. 15 tablet 0  . OMEPRAZOLE 40 MG PO CPDR Oral Take 1 capsule (40 mg total) by mouth daily. 30  capsule 2  . PAROXETINE HCL ER 25 MG PO TB24 Oral Take 1 tablet (25 mg total) by mouth every morning. 30 tablet 2    BP 148/79  Pulse 102  Temp 97.7 F (36.5 C) (Oral)  Resp 20  Ht 5\' 5"  (1.651 m)  Wt 160 lb (72.576 kg)  BMI 26.63 kg/m2  SpO2 100%  Physical Exam  Constitutional: She is oriented to person, place, and time. She appears well-developed and well-nourished. No distress.  HENT:  Head: Normocephalic and atraumatic.  Mouth/Throat: Oropharynx is clear and moist. No oropharyngeal exudate.  Eyes: Conjunctivae are normal. Pupils are equal, round, and reactive to light.  Neck: Normal range of motion. Neck supple.  Cardiovascular: Normal rate, regular rhythm and normal heart sounds.   No murmur  heard. Pulmonary/Chest: Effort normal and breath sounds normal. No respiratory distress.  Abdominal: Soft. There is no tenderness. There is no rebound and no guarding.  Musculoskeletal: Normal range of motion. She exhibits no edema and no tenderness.       Healing circular ecchymosis to right posterior calf  Neurological: She is alert and oriented to person, place, and time. No cranial nerve deficit.  Skin: Skin is warm.    ED Course  Procedures (including critical care time)  Labs Reviewed  URINALYSIS, ROUTINE W REFLEX MICROSCOPIC - Abnormal; Notable for the following:    Ketones, ur 15 (*)     All other components within normal limits  CBC WITH DIFFERENTIAL  COMPREHENSIVE METABOLIC PANEL  CARDIAC PANEL(CRET KIN+CKTOT+MB+TROPI)  D-DIMER, QUANTITATIVE   Dg Chest 2 View  08/08/2011  *RADIOLOGY REPORT*  Clinical Data: Central chest pain and pressure  CHEST - 2 VIEW  Comparison: None.  Findings: The lungs are well aerated.  No pulmonary edema, focal airspace consolidation, or suspicious pulmonary nodule.  Cardiac and mediastinal contours are within normal limits.  No acute osseous finding.  IMPRESSION: No acute cardiopulmonary disease.  Original Report Authenticated By: Vilma Prader     No diagnosis found.    MDM  Active smoker, history of GERD presenting with substernal chest pain radiating to the left and right jaw that lasted 30 minutes now resolved. EKG without acute ischemia. No previous cardiac history.   Troponin negative, d-dimer negative. Pain-free at this time. TIMI 0 symptoms likely GI in nature. However patient does have cardiac risk factors of smoking and Female gender. She is agreeable to observation to CDU on chest pain protocol. D/w [facility] and Dr. Rubin Payor      Date: 08/08/2011  Rate: 94  Rhythm: normal sinus rhythm  QRS Axis: normal  Intervals: normal  ST/T Wave abnormalities: normal  Conduction Disutrbances:right bundle branch block  Narrative  Interpretation:   Old EKG Reviewed: none available    Glynn Octave, MD 08/08/11 1520

## 2011-08-08 NOTE — ED Provider Notes (Signed)
3:07 PM Patient transferred from [facility] for CDU chest pain protocol.  Sign out received from Dr Manus Gunning.  Dr Rubin Payor also to be made aware of the patient by Dr Manus Gunning.  Pt with substernal CP at rest, radiation to left and right jaw.  Associated lightheadedness and nausea.  Patient's only risk factor is smoking.  Plan is for coronary CT.  Patient signed out to [employer], PA-C, who assumes care of patient upon arrival to CDU.    [place], [place] 08/08/11 1525

## 2011-08-08 NOTE — ED Provider Notes (Signed)
3:30 PM Assumed care of patient in the CDU from [facility], [place].  Patient transferred from [facility] and put on the CPP by Dr. Manus Gunning.  Plan is for patient to have a Coronary CT tomorrow morning.  Patient presented this morning with substernal CP at rest with radiation to both her right and left jaw.  Pain associated with lightheadedness.  Cardiac risk factors include hyperlipidemia and smoking.  Reassessed patient.  Patient is currently chest pain free.  She denies any SOB, nausea, vomiting, numbness, tingling or lightheadedness at this time.  Patient alert and orientated x 3, Heart RRR, Lungs CTAB, Abdomen soft with mild tenderness to palpation of mid abdomen, no LE edema, distal pulses intact.  6:30 PM Reassessed patient.  No acute distress.  She denies any chest pain or any other symptoms at this time.  9:30 PM Reassessed patient.  No acute distress.  NO CP.  Patient alert and orientated x 3, Heart RRR, Lungs CTAB, Abdomen soft with mild tenderness to palpation of mid abdomen, no LE edema, distal pulses intact.  12:32 AM Reassessed patient.  No chest pain.  Heart RRR, Lungs CTAB.  Patient signed out to Dr. Jeraldine Loots who assumes care of patient overnight.    Pascal Lux [place], PA-C 08/09/11 [phone]

## 2011-08-09 ENCOUNTER — Observation Stay (HOSPITAL_COMMUNITY): Payer: BC Managed Care – PPO

## 2011-08-09 MED ORDER — METOPROLOL TARTRATE 25 MG PO TABS
100.0000 mg | ORAL_TABLET | Freq: Once | ORAL | Status: AC
  Administered 2011-08-09: 100 mg via ORAL
  Filled 2011-08-09: qty 4

## 2011-08-09 MED ORDER — METOPROLOL TARTRATE 25 MG PO TABS
50.0000 mg | ORAL_TABLET | Freq: Once | ORAL | Status: AC
  Administered 2011-08-09: 50 mg via ORAL
  Filled 2011-08-09: qty 2

## 2011-08-09 MED ORDER — ZOLPIDEM TARTRATE 5 MG PO TABS
5.0000 mg | ORAL_TABLET | Freq: Every evening | ORAL | Status: AC | PRN
  Administered 2011-08-09: 5 mg via ORAL
  Filled 2011-08-09: qty 1

## 2011-08-09 MED ORDER — METOPROLOL TARTRATE 1 MG/ML IV SOLN
INTRAVENOUS | Status: AC
  Administered 2011-08-09: 5 mg
  Filled 2011-08-09: qty 10

## 2011-08-09 MED ORDER — IOHEXOL 350 MG/ML SOLN
80.0000 mL | Freq: Once | INTRAVENOUS | Status: AC | PRN
  Administered 2011-08-09: 80 mL via INTRAVENOUS

## 2011-08-09 MED ORDER — NITROGLYCERIN 0.4 MG SL SUBL
SUBLINGUAL_TABLET | SUBLINGUAL | Status: AC
  Administered 2011-08-09: 0.4 mg via SUBLINGUAL
  Filled 2011-08-09: qty 25

## 2011-08-09 NOTE — ED Provider Notes (Signed)
8:20 AM Assumed care of the patient. Patient seen and evaluated this morning.  She is currently having no symptoms and is resting comfortable. Denies, CP, N/V. No pain at this time.  Awaiting CTA BP 118/71  Pulse 75  Temp 98.1 F (36.7 C) (Oral)  Resp 15  Ht 5\' 5"  (1.651 m)  Wt 160 lb (72.576 kg)  BMI 26.63 kg/m2  SpO2 96% CV: RRR, No M/R/G, Peripheral pulses intact. No peripheral edema. Lungs: CTAB Abd: Soft, Non tender, non distended  CTA  IMPRESSION:  1. No evidence of significant coronary artery disease. The patient's total coronary artery calcium score is 0. 2. No acute findings in the visualized thorax to account for the patient's symptoms. 3. Right coronary artery dominance.  Report was called to CDU mid level at [phone] at 12:00 p.m. on 08/09/2011.  Original Report Authenticated By: Florencia Reasons, M.D.  Patient will be discharged to follow up with PCP.  Arthor Captain, PA-C 08/11/11 [phone]

## 2011-08-09 NOTE — ED Notes (Signed)
Spoke with Dr. Llana Aliment, reported pt.s heart rate is 59 and BP 114/78.  Orders received to giver 50 mg Lopressor and they will scan the pt. In 40 minutes.  Pt. Updated with plan of care.

## 2011-08-09 NOTE — ED Notes (Signed)
Pt. Returned from Coronary CT, Pt. Tolerated without difficulty.   Pt. Ambulated to the bathroom, gait steady.  Pt. Denies any chest pain or sob.  Plan of Care discussed with pt.

## 2011-08-09 NOTE — Progress Notes (Signed)
Observation review is complete. 

## 2011-08-09 NOTE — ED Provider Notes (Signed)
Medical screening examination/treatment/procedure(s) were conducted as a shared visit with non-physician practitioner(s) and myself.  I personally evaluated the patient during the encounter  Derwood Kaplan, MD 08/09/11 0045

## 2011-08-10 ENCOUNTER — Encounter: Payer: BC Managed Care – PPO | Admitting: Family

## 2011-08-14 NOTE — ED Provider Notes (Signed)
Medical screening examination/treatment/procedure(s) were conducted as a shared visit with non-physician practitioner(s) and myself.  I personally evaluated the patient during the encounter  Derwood Kaplan, MD 08/14/11 1504

## 2011-08-15 ENCOUNTER — Ambulatory Visit (INDEPENDENT_AMBULATORY_CARE_PROVIDER_SITE_OTHER): Payer: BC Managed Care – PPO | Admitting: Family

## 2011-08-15 ENCOUNTER — Encounter: Payer: Self-pay | Admitting: Family

## 2011-08-15 ENCOUNTER — Other Ambulatory Visit (HOSPITAL_COMMUNITY)
Admission: RE | Admit: 2011-08-15 | Discharge: 2011-08-15 | Disposition: A | Discharge status: Home / Self Care | Payer: BC Managed Care – PPO | Source: Ambulatory Visit | Attending: Family | Admitting: Family

## 2011-08-15 ENCOUNTER — Ambulatory Visit (HOSPITAL_BASED_OUTPATIENT_CLINIC_OR_DEPARTMENT_OTHER)
Admission: RE | Admit: 2011-08-15 | Discharge: 2011-08-15 | Disposition: A | Discharge status: Home / Self Care | Payer: BC Managed Care – PPO | Source: Ambulatory Visit | Attending: Family | Admitting: Family

## 2011-08-15 ENCOUNTER — Other Ambulatory Visit: Payer: Self-pay | Admitting: Family

## 2011-08-15 VITALS — BP 140/90 | HR 109 | Temp 98.0°F | Resp 14 | Ht 65.5 in | Wt 164.0 lb

## 2011-08-15 DIAGNOSIS — Z0000 Encounter for general adult medical examination without abnormal findings: Secondary | ICD-10-CM | POA: Insufficient documentation

## 2011-08-15 DIAGNOSIS — Z1231 Encounter for screening mammogram for malignant neoplasm of breast: Secondary | ICD-10-CM

## 2011-08-15 DIAGNOSIS — Z01419 Encounter for gynecological examination (general) (routine) without abnormal findings: Secondary | ICD-10-CM | POA: Insufficient documentation

## 2011-08-15 DIAGNOSIS — Z23 Encounter for immunization: Secondary | ICD-10-CM

## 2011-08-15 DIAGNOSIS — R0789 Other chest pain: Secondary | ICD-10-CM

## 2011-08-15 NOTE — Assessment & Plan Note (Signed)
Pap today.  Refer for mammo/dexa.  Tdap today.  Pt counseled on healthy diet, exercise

## 2011-08-15 NOTE — Patient Instructions (Addendum)
Please schedule your mammogram on the first floor. Schedule bone density at the front desk. Follow up in 6 months.

## 2011-08-15 NOTE — Assessment & Plan Note (Signed)
Suspect that pt's symptoms are GI related.  Resume PPI.

## 2011-08-15 NOTE — Progress Notes (Signed)
Subjective:    Patient ID: Anna Crawford, female    DOB: Jan 11, 1960, 51 y.o.   MRN: [id number]  HPI  Pt here for non-fasting physical. Needs tetanus, up to date with colonoscopy 2011. Last pap, mammogram and DEXA 2011. Never had EKG. She completed lab work 2 weeks ago.  Cholesterol was noted to be mildly elevated.    Recent CP- pt recently evaluated in ED.  Records are reviewed.  Cardiac enzymes neg, D Dimer negative. Cardiac CT neg. She was sent home.  Reports occasional substernal chest pain.  Stopped her PPI.     Review of Systems  Constitutional: Negative for unexpected weight change.  HENT: Negative for congestion.   Eyes: Negative for visual disturbance.  Respiratory: Negative for cough.   Cardiovascular: Positive for chest pain.  Gastrointestinal: Negative for vomiting, diarrhea and constipation.  Genitourinary: Negative for dysuria and frequency.  Musculoskeletal: Positive for back pain.  Skin: Negative for rash.  Neurological: Positive for dizziness.  Hematological: Negative for adenopathy.  Psychiatric/Behavioral:       Occasional anxiety      Past Medical History  Diagnosis Date  . Arthritis   . Degenerative disc disease   . History of chicken pox   . Anxiety   . GERD (gastroesophageal reflux disease)     History   Social History  . Marital Status: Divorced    Spouse Name: N/A    Number of Children: 0  . Years of Education: N/A   Occupational History  .  [employer]Duty   Social History Main Topics  . Smoking status: Current Everyday Smoker -- 1.0 packs/day for 18 years    Types: Cigarettes  . Smokeless tobacco: Never Used  . Alcohol Use: Yes     social  . Drug Use: No  . Sexually Active: Not on file   Other Topics Concern  . Not on file   Social History Narrative   Regular exercise:  NoCaffeine Use:  2 cups coffee dailyLives with MomNo childrenCommercial Real Estate    Past Surgical History  Procedure Date  . Cholecystectomy 1998  . Breast  surgery 1997    right breast biopsy--benign  . Mandible osteotomy 1982  . Ankle fracture surgery     Family History  Problem Relation Age of Onset  . Cancer Mother     history of breast  . Arthritis Mother   . Hyperlipidemia Mother   . Cancer Father     lung  . Hypertension Father   . Hyperlipidemia Brother   . Hypertension Brother   . Diabetes Brother   . Thyroid disease Paternal Aunt   . Arthritis Paternal Grandmother   . Thyroid disease Paternal Grandmother     No Known Allergies  Current Outpatient Prescriptions on File Prior to Visit  Medication Sig Dispense Refill  . albuterol (PROVENTIL HFA;VENTOLIN HFA) 108 (90 BASE) MCG/ACT inhaler Inhale 2 puffs into the lungs every 6 (six) hours as needed. For shortness of breath patient has not used as of 08-08-11      . ALPRAZolam (XANAX) 0.25 MG tablet Take 0.25 mg by mouth at bedtime. Can use once during the day if needed      . cyclobenzaprine (FLEXERIL) 5 MG tablet Take 5 mg by mouth at bedtime.      Marland Kitchen omeprazole (PRILOSEC) 40 MG capsule Take 40 mg by mouth daily.      Marland Kitchen PARoxetine (PAXIL-CR) 25 MG 24 hr tablet Take 25 mg by mouth every morning.      Marland Kitchen  ibuprofen (ADVIL,MOTRIN) 400 MG tablet Take 400 mg by mouth daily. For pain      . meloxicam (MOBIC) 7.5 MG tablet Take 7.5 mg by mouth daily.      . naproxen sodium (ANAPROX) 220 MG tablet Take 220 mg by mouth daily. pain        BP 140/90  Pulse 109  Temp 98 F (36.7 C) (Oral)  Resp 14  Ht 5' 5.5" (1.664 m)  Wt 164 lb (74.39 kg)  BMI 26.88 kg/m2  SpO2 96%       Objective:   Physical Exam  Physical Exam  Constitutional: She is oriented to person, place, and time. She appears well-developed and well-nourished. No distress.  HENT:  Head: Normocephalic and atraumatic.  Right Ear: Tympanic membrane and ear canal normal.  Left Ear: Tympanic membrane and ear canal normal.  Mouth/Throat: Oropharynx is clear and moist.  Eyes: Pupils are equal, round, and reactive to  light. No scleral icterus.  Neck: Normal range of motion. No thyromegaly present.  Cardiovascular: Normal rate and regular rhythm.   No murmur heard. Pulmonary/Chest: Effort normal and breath sounds normal. No respiratory distress. He has no wheezes. She has no rales. She exhibits no tenderness.  Abdominal: Soft. Bowel sounds are normal. He exhibits no distension and no mass. There is no tenderness. There is no rebound and no guarding.  Musculoskeletal: She exhibits no edema.  Lymphadenopathy:    She has no cervical adenopathy.  Neurological: She is alert and oriented to person, place, and time. She has normal reflexes. She exhibits normal muscle tone. Coordination normal.  Skin: Skin is warm and dry.  Psychiatric: She has a normal mood and affect. Her behavior is normal. Judgment and thought content normal.  Breasts: Examined lying Right: Without masses, retractions, discharge or axillary adenopathy.  Left: Without masses, retractions, discharge or axillary adenopathy.  Inguinal/mons: Normal without inguinal adenopathy  External genitalia: Normal  BUS/Urethra/Skene's glands: Normal  Bladder: Normal  Vagina: Normal  Cervix: Normal (pap smear performed with chaperone present) Uterus: normal in size, shape and contour. Midline and mobile  Adnexa/parametria:  Rt: Without masses or tenderness.  Lt: Without masses or tenderness.  Anus and perineum: Normal           Assessment & Plan:           Assessment & Plan:

## 2011-08-16 ENCOUNTER — Ambulatory Visit (INDEPENDENT_AMBULATORY_CARE_PROVIDER_SITE_OTHER)
Admission: RE | Admit: 2011-08-16 | Discharge: 2011-08-16 | Disposition: A | Discharge status: Home / Self Care | Payer: BC Managed Care – PPO | Source: Ambulatory Visit

## 2011-08-16 DIAGNOSIS — Z0000 Encounter for general adult medical examination without abnormal findings: Secondary | ICD-10-CM

## 2011-08-16 DIAGNOSIS — M810 Age-related osteoporosis without current pathological fracture: Secondary | ICD-10-CM

## 2011-08-16 LAB — HM DEXA SCAN: HM Dexa Scan: NORMAL

## 2011-08-17 ENCOUNTER — Encounter: Payer: Self-pay | Admitting: Family

## 2011-08-21 ENCOUNTER — Ambulatory Visit (INDEPENDENT_AMBULATORY_CARE_PROVIDER_SITE_OTHER): Payer: BC Managed Care – PPO | Admitting: Psychology

## 2011-08-21 ENCOUNTER — Encounter: Payer: Self-pay | Admitting: Family

## 2011-08-21 DIAGNOSIS — F331 Major depressive disorder, recurrent, moderate: Secondary | ICD-10-CM

## 2011-10-17 ENCOUNTER — Ambulatory Visit (INDEPENDENT_AMBULATORY_CARE_PROVIDER_SITE_OTHER): Payer: BC Managed Care – PPO

## 2011-10-17 DIAGNOSIS — Z23 Encounter for immunization: Secondary | ICD-10-CM

## 2011-10-23 ENCOUNTER — Other Ambulatory Visit: Payer: Self-pay | Admitting: Internal Medicine

## 2011-10-23 ENCOUNTER — Other Ambulatory Visit: Payer: Self-pay | Admitting: Family

## 2011-10-24 NOTE — Telephone Encounter (Signed)
Refills left on pharmacy voicemail. Alprazolam # 60 x no refills, cyclobenzaprine #30 x no refills.

## 2011-10-24 NOTE — Telephone Encounter (Signed)
OK to send 1 month of each no refills pls.

## 2011-11-28 ENCOUNTER — Telehealth: Payer: Self-pay | Admitting: Family

## 2011-11-28 NOTE — Telephone Encounter (Signed)
OK to send 30 day supply of each please.

## 2011-11-28 NOTE — Telephone Encounter (Signed)
Refill- alprazolam 0.25mg  tablets. Take one tablet by mouth twice daily as needed. Qty 60 last fill 10.16.13  Refill- cyclobenzaprine 5mg  tablets. Take one tablet by mouth every night at bedtime. Qty 30 last fill 10.16.13

## 2011-11-28 NOTE — Telephone Encounter (Signed)
Both medications called in on 10/23/11 as below.  Please advise re: refills.

## 2011-11-29 MED ORDER — CYCLOBENZAPRINE HCL 5 MG PO TABS: 5.0000 mg | ORAL_TABLET | Freq: Every day | ORAL | Status: DC

## 2011-11-29 MED ORDER — ALPRAZOLAM 0.25 MG PO TABS: 0.2500 mg | ORAL_TABLET | Freq: Two times a day (BID) | ORAL | Status: DC | PRN

## 2011-11-29 NOTE — Telephone Encounter (Signed)
Rxs called to [facility]. Alprazolam #60, cyclobenzaprine #30 x no refills each

## 2011-12-31 ENCOUNTER — Other Ambulatory Visit: Payer: Self-pay | Admitting: *Deleted

## 2011-12-31 ENCOUNTER — Ambulatory Visit: Payer: BC Managed Care – PPO | Admitting: Family

## 2011-12-31 MED ORDER — CYCLOBENZAPRINE HCL 5 MG PO TABS: 5.0000 mg | ORAL_TABLET | Freq: Every day | ORAL | Status: DC

## 2011-12-31 MED ORDER — ALPRAZOLAM 0.25 MG PO TABS: 0.2500 mg | ORAL_TABLET | Freq: Two times a day (BID) | ORAL | Status: DC | PRN

## 2011-12-31 NOTE — Telephone Encounter (Signed)
OK to send #60 of alprazolam and #30 of cyclobenzaprine.

## 2011-12-31 NOTE — Telephone Encounter (Signed)
Rx[s] to pharmacy/SLS 

## 2011-12-31 NOTE — Telephone Encounter (Signed)
Request for: Alprazolam 0.25 mg [Last Rx 11.21.13 #60x0]/SLS                      Cyclobenzaprine 5 mg [Last Rx 11.21.13 #30x0]/SLS Please advise.

## 2012-02-01 ENCOUNTER — Telehealth: Payer: Self-pay | Admitting: Family

## 2012-02-01 MED ORDER — CYCLOBENZAPRINE HCL 5 MG PO TABS: 5.0000 mg | ORAL_TABLET | Freq: Every day | ORAL | Status: DC

## 2012-02-01 NOTE — Telephone Encounter (Signed)
Refill- cyclobenzaprine 5mg  tablets. Take one tablet by mouth every night at bedtime. Qty 30 last fill 12.23.13

## 2012-02-01 NOTE — Telephone Encounter (Signed)
Rx sent to [facility]

## 2012-02-04 ENCOUNTER — Telehealth: Payer: Self-pay | Admitting: Family

## 2012-02-04 MED ORDER — ALPRAZOLAM 0.25 MG PO TABS: 0.2500 mg | ORAL_TABLET | Freq: Two times a day (BID) | ORAL | Status: DC | PRN

## 2012-02-04 NOTE — Telephone Encounter (Signed)
Refill left on pharmacy voicemail, #60 x no refills.

## 2012-02-04 NOTE — Telephone Encounter (Signed)
Refill- alprazolam 0.25mg  tablets. Take one tablet by mouth twice daily as needed for sleep. Qty 60 last fill 12.23.13

## 2012-02-11 ENCOUNTER — Ambulatory Visit (INDEPENDENT_AMBULATORY_CARE_PROVIDER_SITE_OTHER): Payer: BC Managed Care – PPO | Admitting: Family

## 2012-02-11 ENCOUNTER — Encounter: Payer: Self-pay | Admitting: Family

## 2012-02-11 VITALS — BP 118/80 | HR 110 | Temp 98.9°F | Resp 16 | Wt 166.0 lb

## 2012-02-11 DIAGNOSIS — F411 Generalized anxiety disorder: Secondary | ICD-10-CM

## 2012-02-11 DIAGNOSIS — M542 Cervicalgia: Secondary | ICD-10-CM

## 2012-02-11 DIAGNOSIS — E538 Deficiency of other specified B group vitamins: Secondary | ICD-10-CM

## 2012-02-11 LAB — VITAMIN B12: Vitamin B-12: 291 pg/mL (ref 211–911)

## 2012-02-11 MED ORDER — CYCLOBENZAPRINE HCL 5 MG PO TABS: 5.0000 mg | ORAL_TABLET | Freq: Every day | ORAL | Status: DC

## 2012-02-11 MED ORDER — ALPRAZOLAM 0.25 MG PO TABS: 0.2500 mg | ORAL_TABLET | Freq: Two times a day (BID) | ORAL | Status: DC | PRN

## 2012-02-11 MED ORDER — PAROXETINE HCL ER 25 MG PO TB24: 25.0000 mg | ORAL_TABLET | ORAL | Status: DC

## 2012-02-11 MED ORDER — OMEPRAZOLE 40 MG PO CPDR: 40.0000 mg | DELAYED_RELEASE_CAPSULE | Freq: Every day | ORAL | Status: DC

## 2012-02-11 NOTE — Assessment & Plan Note (Signed)
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  She declines further work up at this time. Discussed symptoms likely arthritis.  Recommended short course of motrin as needed, tylenol as needed for daily use.

## 2012-02-11 NOTE — Patient Instructions (Addendum)
Please complete your blood work prior to leaving.  Follow up in 3 months. 

## 2012-02-11 NOTE — Assessment & Plan Note (Signed)
Stable on alprazolam and paxil, continue same.

## 2012-02-11 NOTE — Progress Notes (Signed)
Subjective:    Patient ID: Anna Crawford, female    DOB: 28-Jan-1960, 52 y.o.   MRN: [id number]  HPI  Anna Crawford is a 52 yr old female who presents today for routine follow up.   Fatigue- reports hx of b12 deficiency. She has been on nose spray in the past but has not take for some time.    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  Hx of DDD of the neck.  She reports hx of numbness in her hands previously, but reports that recently this has been OK.  Neck nurts with turning sometimes.  Notes "crunching and stiffness."    Anxiety- reports that this has been well controlled with paxil and prn alprazolam.    Review of Systems See HPI  Past Medical History  Diagnosis Date  . Arthritis   . Degenerative disc disease   . History of chicken pox   . Anxiety   . GERD (gastroesophageal reflux disease)     History   Social History  . Marital Status: Divorced    Spouse Name: N/A    Number of Children: 0  . Years of Education: N/A   Occupational History  .  [employer].Duty   Social History Main Topics  . Smoking status: Current Every Day Smoker -- 1.0 packs/day for 18 years    Types: Cigarettes  . Smokeless tobacco: Never Used  . Alcohol Use: Yes     Comment: social  . Drug Use: No  . Sexually Active: Not on file   Other Topics Concern  . Not on file   Social History Narrative   Regular exercise:  NoCaffeine Use:  2 cups coffee dailyLives with MomNo childrenCommercial Real Estate    Past Surgical History  Procedure Date  . Cholecystectomy 1998  . Breast surgery 1997    right breast biopsy--benign  . Mandible osteotomy 1982  . Ankle fracture surgery     Family History  Problem Relation Age of Onset  . Cancer Mother     history of breast  . Arthritis Mother   . Hyperlipidemia Mother   . Cancer Father     lung  . Hypertension Father   . Hyperlipidemia Brother   . Hypertension Brother   . Diabetes Brother   . Thyroid disease Paternal Aunt   .  Arthritis Paternal Grandmother   . Thyroid disease Paternal Grandmother     No Known Allergies  Current Outpatient Prescriptions on File Prior to Visit  Medication Sig Dispense Refill  . albuterol (PROVENTIL HFA;VENTOLIN HFA) 108 (90 BASE) MCG/ACT inhaler Inhale 2 puffs into the lungs every 6 (six) hours as needed. For shortness of breath patient has not used as of 08-08-11      . ALPRAZolam (XANAX) 0.25 MG tablet Take 1 tablet (0.25 mg total) by mouth 2 (two) times daily as needed for sleep.  60 tablet  0  . Calcium Carbonate-Vitamin D (CALTRATE 600+D) 600-400 MG-UNIT per tablet Take 1 tablet by mouth 2 (two) times daily.      Marland Kitchen PARoxetine (PAXIL-CR) 25 MG 24 hr tablet Take 1 tablet (25 mg total) by mouth every morning.  90 tablet  1    BP 118/80  Pulse 110  Temp 98.9 F (37.2 C) (Oral)  Resp 16  Wt 166 lb (75.297 kg)  SpO2 99%       Objective:   Physical Exam  Constitutional: She appears well-developed and well-nourished. No distress.  Cardiovascular: Normal rate  and regular rhythm.   No murmur heard. Pulmonary/Chest: Effort normal and breath sounds normal. No respiratory distress. She has no wheezes. She has no rales. She exhibits no tenderness.  Musculoskeletal: She exhibits no edema.  Skin: Skin is warm and dry.  Psychiatric: She has a normal mood and affect. Her behavior is normal. Judgment and thought content normal.          Assessment & Plan:

## 2012-02-11 NOTE — Assessment & Plan Note (Signed)
Obtain b12 level.  Plan to resume supplement as needed.

## 2012-02-14 ENCOUNTER — Telehealth: Payer: Self-pay | Admitting: Family

## 2012-02-14 DIAGNOSIS — E538 Deficiency of other specified B group vitamins: Secondary | ICD-10-CM

## 2012-02-14 MED ORDER — CYANOCOBALAMIN 500 MCG/0.1ML NA SOLN: NASAL | Status: DC

## 2012-02-14 NOTE — Telephone Encounter (Signed)
Please call pt and let her know that b12 is low normal.  I would like her to restart b12 nasal spray once weekly and repeat level in 12 weeks.  (266.2)

## 2012-02-14 NOTE — Telephone Encounter (Signed)
Left message on cell# to return my call. 

## 2012-02-15 NOTE — Telephone Encounter (Signed)
Notified. 

## 2012-02-15 NOTE — Telephone Encounter (Signed)
Notified pt, future order entered and given to the lab.

## 2012-02-18 ENCOUNTER — Telehealth: Payer: Self-pay | Admitting: *Deleted

## 2012-02-18 NOTE — Telephone Encounter (Signed)
[facility] Triage Call Report Triage Record Num: [id number] Operator: Levora Angel Patient Name: Anna Crawford Call Date & Time: 02/16/2012 4:04:05PM Patient Phone: [phone] PCP: Sandford Craze, NP Patient Gender: Female PCP Fax : [phone] Patient DOB: Feb 14, 1960 Practice Name: [facility] Reason for Call: Caller: Avelina/Patient; PCP: Peggyann Juba, Melissa (Adults only); CB#: [phone]; Call regarding Medication Issue; Medication(s): B12 nasal spray ; Onset: 02/16/12 Patient calling in states that she contacted [facility] to [facility] prescription for B12 spray and per [facility] they did not receive the request electronically. Verified medication order for Cyanocobalamin 500 MCG/0.10ml solution One spray to one nostril once weekly Qty 1 bottle with 2 refills ordered by Sandford Craze NP. Medication called in to [facility] [phone]. Protocol(s) Used: Office Note Recommended Outcome per Protocol: Information Noted and Sent to Office Reason for Outcome: Caller information to office

## 2012-03-10 ENCOUNTER — Emergency Department (HOSPITAL_BASED_OUTPATIENT_CLINIC_OR_DEPARTMENT_OTHER): Payer: BC Managed Care – PPO

## 2012-03-10 ENCOUNTER — Encounter (HOSPITAL_BASED_OUTPATIENT_CLINIC_OR_DEPARTMENT_OTHER): Payer: Self-pay | Admitting: Family Medicine

## 2012-03-10 ENCOUNTER — Emergency Department (HOSPITAL_BASED_OUTPATIENT_CLINIC_OR_DEPARTMENT_OTHER)
Admission: EM | Admit: 2012-03-10 | Discharge: 2012-03-10 | Disposition: A | Discharge status: Home / Self Care | Payer: BC Managed Care – PPO | Attending: Emergency Medicine | Admitting: Emergency Medicine

## 2012-03-10 DIAGNOSIS — S92309A Fracture of unspecified metatarsal bone(s), unspecified foot, initial encounter for closed fracture: Secondary | ICD-10-CM | POA: Insufficient documentation

## 2012-03-10 DIAGNOSIS — Z8739 Personal history of other diseases of the musculoskeletal system and connective tissue: Secondary | ICD-10-CM | POA: Insufficient documentation

## 2012-03-10 DIAGNOSIS — F17200 Nicotine dependence, unspecified, uncomplicated: Secondary | ICD-10-CM | POA: Insufficient documentation

## 2012-03-10 DIAGNOSIS — X500XXA Overexertion from strenuous movement or load, initial encounter: Secondary | ICD-10-CM | POA: Insufficient documentation

## 2012-03-10 DIAGNOSIS — F411 Generalized anxiety disorder: Secondary | ICD-10-CM | POA: Insufficient documentation

## 2012-03-10 DIAGNOSIS — Y9301 Activity, walking, marching and hiking: Secondary | ICD-10-CM | POA: Insufficient documentation

## 2012-03-10 DIAGNOSIS — Z8619 Personal history of other infectious and parasitic diseases: Secondary | ICD-10-CM | POA: Insufficient documentation

## 2012-03-10 DIAGNOSIS — Z87828 Personal history of other (healed) physical injury and trauma: Secondary | ICD-10-CM | POA: Insufficient documentation

## 2012-03-10 DIAGNOSIS — Z79899 Other long term (current) drug therapy: Secondary | ICD-10-CM | POA: Insufficient documentation

## 2012-03-10 DIAGNOSIS — K219 Gastro-esophageal reflux disease without esophagitis: Secondary | ICD-10-CM | POA: Insufficient documentation

## 2012-03-10 DIAGNOSIS — Y929 Unspecified place or not applicable: Secondary | ICD-10-CM | POA: Insufficient documentation

## 2012-03-10 MED ORDER — OXYCODONE-ACETAMINOPHEN 5-325 MG PO TABS: 2.0000 | ORAL_TABLET | ORAL | Status: DC | PRN

## 2012-03-10 NOTE — ED Notes (Signed)
Pt sts her dog pulled her down yesterday. C/o pain to lateral right ankle, no obvious deformity, cms intact.

## 2012-03-10 NOTE — ED Provider Notes (Addendum)
History     CSN: [id number]  Arrival date & time 03/10/12  0900   First MD Initiated Contact with Patient 03/10/12 [phone]      Chief Complaint  Patient presents with  . Ankle Pain    (Consider location/radiation/quality/duration/timing/severity/associated sxs/prior treatment) HPI Patient with injury to right foot and ankle last night when she twisted her right foot while walking dog.  Patient fell to ground but denies other injuries.  Patient able to get in to house but has pain with weight bearing.  She used an ankle brace without relief, ibuprofen with some relief.  There is positional discomfort and with weight bearing.  She has bruising to the lateral aspect of the foot.  No distal parasthesias.  No head trauma.  HIstory of left ankle trimalleolar fracture repaird in 2000.  Past Medical History  Diagnosis Date  . Arthritis   . Degenerative disc disease   . History of chicken pox   . Anxiety   . GERD (gastroesophageal reflux disease)     Past Surgical History  Procedure Laterality Date  . Cholecystectomy  1998  . Breast surgery  1997    right breast biopsy--benign  . Mandible osteotomy  1982  . Ankle fracture surgery      Family History  Problem Relation Age of Onset  . Cancer Mother     history of breast  . Arthritis Mother   . Hyperlipidemia Mother   . Cancer Father     lung  . Hypertension Father   . Hyperlipidemia Brother   . Hypertension Brother   . Diabetes Brother   . Thyroid disease Paternal Aunt   . Arthritis Paternal Grandmother   . Thyroid disease Paternal Grandmother     History  Substance Use Topics  . Smoking status: Current Every Day Smoker -- 1.00 packs/day for 18 years    Types: Cigarettes  . Smokeless tobacco: Never Used  . Alcohol Use: Yes     Comment: social    OB History   Grav Para Term Preterm Abortions TAB SAB Ect Mult Living                  Review of Systems  All other systems reviewed and are negative.    Allergies   Review of patient's allergies indicates no known allergies.  Home Medications   Current Outpatient Rx  Name  Route  Sig  Dispense  Refill  . albuterol (PROVENTIL HFA;VENTOLIN HFA) 108 (90 BASE) MCG/ACT inhaler   Inhalation   Inhale 2 puffs into the lungs every 6 (six) hours as needed. For shortness of breath patient has not used as of 08-08-11         . ALPRAZolam (XANAX) 0.25 MG tablet   Oral   Take 1 tablet (0.25 mg total) by mouth 2 (two) times daily as needed for sleep.   180 tablet   0   . Calcium Carbonate-Vitamin D (CALTRATE 600+D) 600-400 MG-UNIT per tablet   Oral   Take 1 tablet by mouth 2 (two) times daily.         . Cyanocobalamin 500 MCG/0.1ML SOLN      One spray to one nostril once weekly.   1 Bottle   2   . cyclobenzaprine (FLEXERIL) 5 MG tablet   Oral   Take 1 tablet (5 mg total) by mouth at bedtime.   90 tablet   1   . omeprazole (PRILOSEC) 40 MG capsule   Oral   Take  1 capsule (40 mg total) by mouth daily.   90 capsule   1   . PARoxetine (PAXIL-CR) 25 MG 24 hr tablet   Oral   Take 1 tablet (25 mg total) by mouth every morning.   90 tablet   1     BP 136/87  Pulse 102  Temp(Src) 98.6 F (37 C) (Oral)  Resp 18  Ht 5\' 5"  (1.651 m)  Wt 162 lb (73.483 kg)  BMI 26.96 kg/m2  SpO2 100%  Physical Exam  Nursing note and vitals reviewed. Constitutional: She is oriented to person, place, and time. She appears well-developed.  HENT:  Head: Normocephalic and atraumatic.  Eyes: Pupils are equal, round, and reactive to light.  Neck: Normal range of motion.  Cardiovascular: Normal rate.   Pulmonary/Chest: Effort normal.  Abdominal: Soft.  Musculoskeletal: She exhibits tenderness.  Right foot with swelling to lateral aspect, ttp base of fifth metarsal.  Toes pink, sensation intact, good movement.  No proximal tendernss.   Neurological: She is alert and oriented to person, place, and time.  Skin: Skin is warm.  Psychiatric: She has a normal  mood and affect. Her behavior is normal.    ED Course  Procedures (including critical care time)  Labs Reviewed - No data to display No results found.   No diagnosis found.    MDM  Transverse fracture base of fifth metatarsal. Plan posterior splint and referral to followup with Dr. Pearletha Forge. Patient is advised of signs or symptoms of compartment syndrome advised him to not bear weight and keep foot elevated.        Hilario Quarry, MD 03/10/12 1008  Hilario Quarry, MD 04/06/12 [phone]

## 2012-03-11 ENCOUNTER — Ambulatory Visit: Payer: BC Managed Care – PPO | Admitting: Family Medicine

## 2012-03-12 ENCOUNTER — Encounter: Payer: Self-pay | Admitting: Family Medicine

## 2012-03-12 ENCOUNTER — Ambulatory Visit (INDEPENDENT_AMBULATORY_CARE_PROVIDER_SITE_OTHER): Payer: BC Managed Care – PPO | Admitting: Family Medicine

## 2012-03-12 VITALS — BP 140/97 | HR 82 | Ht 65.0 in | Wt 160.0 lb

## 2012-03-12 DIAGNOSIS — S92351A Displaced fracture of fifth metatarsal bone, right foot, initial encounter for closed fracture: Secondary | ICD-10-CM | POA: Insufficient documentation

## 2012-03-12 DIAGNOSIS — S92309A Fracture of unspecified metatarsal bone(s), unspecified foot, initial encounter for closed fracture: Secondary | ICD-10-CM

## 2012-03-12 MED ORDER — OXYCODONE-ACETAMINOPHEN 5-325 MG PO TABS: 1.0000 | ORAL_TABLET | Freq: Four times a day (QID) | ORAL | Status: DC | PRN

## 2012-03-12 NOTE — Progress Notes (Signed)
Subjective:    Patient ID: Anna Crawford, female    DOB: [birth year], 52 y.o.   MRN: [id number]  PCP: Sandford Craze  HPI 52 yo F here for right foot injury.  Patient reports on 3/2 she was out walking her dog, talking to someone when the dog darted to the right (saw another dog) causing her to invert her right foot and fall to left side onto wrist, left knee. Could only bear weight if walking on right heel. + swelling and bruising. No prior right foot injuries (maybe sprained this ankle before). Went to ED - put in a posterior splint with crutches. Has been icing, elevating, taking oxycodone.  Past Medical History  Diagnosis Date  . Arthritis   . Degenerative disc disease   . History of chicken pox   . Anxiety   . GERD (gastroesophageal reflux disease)     Current Outpatient Prescriptions on File Prior to Visit  Medication Sig Dispense Refill  . albuterol (PROVENTIL HFA;VENTOLIN HFA) 108 (90 BASE) MCG/ACT inhaler Inhale 2 puffs into the lungs every 6 (six) hours as needed. For shortness of breath patient has not used as of 08-08-11      . ALPRAZolam (XANAX) 0.25 MG tablet Take 1 tablet (0.25 mg total) by mouth 2 (two) times daily as needed for sleep.  180 tablet  0  . Calcium Carbonate-Vitamin D (CALTRATE 600+D) 600-400 MG-UNIT per tablet Take 1 tablet by mouth 2 (two) times daily.      . Cyanocobalamin 500 MCG/0.1ML SOLN One spray to one nostril once weekly.  1 Bottle  2  . cyclobenzaprine (FLEXERIL) 5 MG tablet Take 1 tablet (5 mg total) by mouth at bedtime.  90 tablet  1  . omeprazole (PRILOSEC) 40 MG capsule Take 1 capsule (40 mg total) by mouth daily.  90 capsule  1  . PARoxetine (PAXIL-CR) 25 MG 24 hr tablet Take 1 tablet (25 mg total) by mouth every morning.  90 tablet  1   No current facility-administered medications on file prior to visit.    Past Surgical History  Procedure Laterality Date  . Cholecystectomy  1998  . Breast surgery  1997    right breast  biopsy--benign  . Mandible osteotomy  1982  . Ankle fracture surgery      No Known Allergies  History   Social History  . Marital Status: Divorced    Spouse Name: N/A    Number of Children: 0  . Years of Education: N/A   Occupational History  .  [employer]Duty   Social History Main Topics  . Smoking status: Current Every Day Smoker -- 1.00 packs/day for 18 years    Types: Cigarettes  . Smokeless tobacco: Never Used  . Alcohol Use: Yes     Comment: social  . Drug Use: No  . Sexually Active: Not on file   Other Topics Concern  . Not on file   Social History Narrative   Regular exercise:  No   Caffeine Use:  2 cups coffee daily   Lives with Mom   No children   [employer]    Family History  Problem Relation Age of Onset  . Cancer Mother     history of breast  . Arthritis Mother   . Hyperlipidemia Mother   . Hypertension Mother   . Cancer Father     lung  . Hypertension Father   . Hyperlipidemia Brother   . Hypertension Brother   . Diabetes Brother   .  Thyroid disease Paternal Aunt   . Arthritis Paternal Grandmother   . Thyroid disease Paternal Grandmother   . Heart attack Neg Hx     BP 140/97  Pulse 82  Ht 5\' 5"  (1.651 m)  Wt 160 lb (72.576 kg)  BMI 26.63 kg/m2  Review of Systems See HPI above.    Objective:   Physical Exam Gen: NAD  R foot/ankle: Mod swelling, bruising lateral foot/ankle.  No other deformity. Mod limitation ROM all directions.  Did not test strength. TTP greatest over 5th metatarsal, less over ATFL, dorsolateral foot.  No medial malleolus, navicular, other foot/ankle TTP.  No fibular head TTP. Negative ant drawer, 1+ talar tilt.   Negative syndesmotic compression. Thompsons test negative. NV intact distally.     Assessment & Plan:  1. Right 5th metatarsal avulsion fracture - radiographs reviewed and fracture line does not extend to articulation with 4th MT, not a Jones fracture.  Start with cam walker, icing,  elevation.  Oxycodone and nsaids as needed for pain.  Out of work next 2 weeks due to being on narcotic pain medication.  Will reassess in 2 weeks (could work from home possibly if no longer needing the pain medicine during the day).

## 2012-03-12 NOTE — Patient Instructions (Addendum)
You have an avulsion fracture of your fifth metatarsal. Out of work for next 2 weeks. Follow up with me in 2 weeks - we will repeat x-rays at that time and reassess your work status. Oxycodone as needed for severe pain. Ok to take aleve OR ibuprofen in addition to this. Elevate as much as possible. Cam walker for support and protection of fracture - wear at all times when up and walking around - ok to take off to wash area, ice, if lying down. Ice 15 minutes at a time 3-4 times a day.

## 2012-03-12 NOTE — Assessment & Plan Note (Signed)
Right 5th metatarsal avulsion fracture - radiographs reviewed and fracture line does not extend to articulation with 4th MT, not a Jones fracture.  Start with cam walker, icing, elevation.  Oxycodone and nsaids as needed for pain.  Out of work next 2 weeks due to being on narcotic pain medication.  Will reassess in 2 weeks (could work from home possibly if no longer needing the pain medicine during the day).

## 2012-03-26 ENCOUNTER — Encounter: Payer: Self-pay | Admitting: Family Medicine

## 2012-03-26 ENCOUNTER — Ambulatory Visit (INDEPENDENT_AMBULATORY_CARE_PROVIDER_SITE_OTHER): Payer: BC Managed Care – PPO | Admitting: Family Medicine

## 2012-03-26 ENCOUNTER — Ambulatory Visit (HOSPITAL_BASED_OUTPATIENT_CLINIC_OR_DEPARTMENT_OTHER)
Admission: RE | Admit: 2012-03-26 | Discharge: 2012-03-26 | Disposition: A | Discharge status: Home / Self Care | Payer: BC Managed Care – PPO | Source: Ambulatory Visit | Attending: Family Medicine | Admitting: Family Medicine

## 2012-03-26 VITALS — BP 124/89 | HR 92 | Ht 65.0 in | Wt 160.0 lb

## 2012-03-26 DIAGNOSIS — M79609 Pain in unspecified limb: Secondary | ICD-10-CM | POA: Insufficient documentation

## 2012-03-26 DIAGNOSIS — Z4789 Encounter for other orthopedic aftercare: Secondary | ICD-10-CM | POA: Insufficient documentation

## 2012-03-26 MED ORDER — OXYCODONE-ACETAMINOPHEN 5-325 MG PO TABS: 1.0000 | ORAL_TABLET | Freq: Four times a day (QID) | ORAL | Status: DC | PRN

## 2012-03-27 ENCOUNTER — Encounter: Payer: Self-pay | Admitting: Family Medicine

## 2012-03-27 NOTE — Assessment & Plan Note (Signed)
Right 5th metatarsal avulsion fracture - radiographs reviewed today do show widening at the fracture site - has gone from 2.1 to 3.0 mm of separation maximally by my measurement.  We had a discussion today that displacement of 3mm is usually the threshold where she is to consider surgical intervention and is at greater risk of nonunion.  She would like to continue with conservative treatment at this time which I agree with but if widening worsens over next 2-4 weeks or she does not have clinical improvement certainly by 6 weeks out, will need to refer her to ortho to consider ORIF.  In meantime continue oxycodone, cam walker with crutches or a walker, icing, elevation.  F/u in 2 weeks to repeat eval and radiographs.  Out of work still for next 2 weeks as she's requiring oxycodone for pain management.  Hopefully won't need as much at follow-up and we can consider allowing her to work from home. 

## 2012-03-27 NOTE — Progress Notes (Signed)
Subjective:    Patient ID: Anna Crawford, female    DOB: [birth year], 52 y.o.   MRN: [id number]  PCP: Sandford Craze  HPI  52 yo F here for f/u right foot injury.  3/5: Patient reports on 3/2 she was out walking her dog, talking to someone when the dog darted to the right (saw another dog) causing her to invert her right foot and fall to left side onto wrist, left knee. Could only bear weight if walking on right heel. + swelling and bruising. No prior right foot injuries (maybe sprained this ankle before). Went to ED - put in a posterior splint with crutches. Has been icing, elevating, taking oxycodone.  3/19: Patient returns with continued pain at area of right base 5th metatarsal fracture. Taking oxycodone as needed for pain. Has been elevating. Wearing cam walker and using either crutches or a walker to help get around. Icing some initially.  Past Medical History  Diagnosis Date  . Arthritis   . Degenerative disc disease   . History of chicken pox   . Anxiety   . GERD (gastroesophageal reflux disease)     Current Outpatient Prescriptions on File Prior to Visit  Medication Sig Dispense Refill  . albuterol (PROVENTIL HFA;VENTOLIN HFA) 108 (90 BASE) MCG/ACT inhaler Inhale 2 puffs into the lungs every 6 (six) hours as needed. For shortness of breath patient has not used as of 08-08-11      . ALPRAZolam (XANAX) 0.25 MG tablet Take 1 tablet (0.25 mg total) by mouth 2 (two) times daily as needed for sleep.  180 tablet  0  . Calcium Carbonate-Vitamin D (CALTRATE 600+D) 600-400 MG-UNIT per tablet Take 1 tablet by mouth 2 (two) times daily.      . Cyanocobalamin 500 MCG/0.1ML SOLN One spray to one nostril once weekly.  1 Bottle  2  . cyclobenzaprine (FLEXERIL) 5 MG tablet Take 1 tablet (5 mg total) by mouth at bedtime.  90 tablet  1  . omeprazole (PRILOSEC) 40 MG capsule Take 1 capsule (40 mg total) by mouth daily.  90 capsule  1  . PARoxetine (PAXIL-CR) 25 MG 24 hr tablet Take 1  tablet (25 mg total) by mouth every morning.  90 tablet  1   No current facility-administered medications on file prior to visit.    Past Surgical History  Procedure Laterality Date  . Cholecystectomy  1998  . Breast surgery  1997    right breast biopsy--benign  . Mandible osteotomy  1982  . Ankle fracture surgery      No Known Allergies  History   Social History  . Marital Status: Divorced    Spouse Name: N/A    Number of Children: 0  . Years of Education: N/A   Occupational History  .  [employer]Duty   Social History Main Topics  . Smoking status: Current Every Day Smoker -- 1.00 packs/day for 18 years    Types: Cigarettes  . Smokeless tobacco: Never Used  . Alcohol Use: Yes     Comment: social  . Drug Use: No  . Sexually Active: Not on file   Other Topics Concern  . Not on file   Social History Narrative   Regular exercise:  No   Caffeine Use:  2 cups coffee daily   Lives with Mom   No children   [employer]    Family History  Problem Relation Age of Onset  . Cancer Mother     history of  breast  . Arthritis Mother   . Hyperlipidemia Mother   . Hypertension Mother   . Cancer Father     lung  . Hypertension Father   . Hyperlipidemia Brother   . Hypertension Brother   . Diabetes Brother   . Thyroid disease Paternal Aunt   . Arthritis Paternal Grandmother   . Thyroid disease Paternal Grandmother   . Heart attack Neg Hx     BP 124/89  Pulse 92  Ht 5\' 5"  (1.651 m)  Wt 160 lb (72.576 kg)  BMI 26.63 kg/m2  Review of Systems  See HPI above.    Objective:   Physical Exam  Gen: NAD  R foot/ankle: Mild swelling, bruising lateral foot/ankle.  No other deformity. Did not test ROM, strength today. TTP at base 5th metatarsal.  No other TTP foot, ankle. Negative ant drawer, 1+ talar tilt.   Negative syndesmotic compression. Thompsons test negative. NV intact distally.     Assessment & Plan:  1. Right 5th metatarsal avulsion fracture -  radiographs reviewed today do show widening at the fracture site - has gone from 2.1 to 3.0 mm of separation maximally by my measurement.  We had a discussion today that displacement of 3mm is usually the threshold where she is to consider surgical intervention and is at greater risk of nonunion.  She would like to continue with conservative treatment at this time which I agree with but if widening worsens over next 2-4 weeks or she does not have clinical improvement certainly by 6 weeks out, will need to refer her to ortho to consider ORIF.  In meantime continue oxycodone, cam walker with crutches or a walker, icing, elevation.  F/u in 2 weeks to repeat eval and radiographs.  Out of work still for next 2 weeks as she's requiring oxycodone for pain management.  Hopefully won't need as much at follow-up and we can consider allowing her to work from home.

## 2012-03-27 NOTE — Patient Instructions (Addendum)
Follow up in 2 weeks to repeat eval and x-rays.

## 2012-03-27 NOTE — Assessment & Plan Note (Signed)
Right 5th metatarsal avulsion fracture - radiographs reviewed today do show widening at the fracture site - has gone from 2.1 to 3.0 mm of separation maximally by my measurement.  We had a discussion today that displacement of 3mm is usually the threshold where she is to consider surgical intervention and is at greater risk of nonunion.  She would like to continue with conservative treatment at this time which I agree with but if widening worsens over next 2-4 weeks or she does not have clinical improvement certainly by 6 weeks out, will need to refer her to ortho to consider ORIF.  In meantime continue oxycodone, cam walker with crutches or a walker, icing, elevation.  F/u in 2 weeks to repeat eval and radiographs.  Out of work still for next 2 weeks as she's requiring oxycodone for pain management.  Hopefully won't need as much at follow-up and we can consider allowing her to work from home.

## 2012-04-09 ENCOUNTER — Ambulatory Visit (HOSPITAL_BASED_OUTPATIENT_CLINIC_OR_DEPARTMENT_OTHER)
Admission: RE | Admit: 2012-04-09 | Discharge: 2012-04-09 | Disposition: A | Discharge status: Home / Self Care | Payer: BC Managed Care – PPO | Source: Ambulatory Visit | Attending: Family Medicine | Admitting: Family Medicine

## 2012-04-09 ENCOUNTER — Ambulatory Visit (INDEPENDENT_AMBULATORY_CARE_PROVIDER_SITE_OTHER): Payer: BC Managed Care – PPO | Admitting: Family Medicine

## 2012-04-09 ENCOUNTER — Encounter: Payer: Self-pay | Admitting: Family Medicine

## 2012-04-09 VITALS — BP 143/96 | HR 84 | Ht 65.0 in | Wt 160.0 lb

## 2012-04-09 DIAGNOSIS — M79671 Pain in right foot: Secondary | ICD-10-CM

## 2012-04-09 DIAGNOSIS — S92309A Fracture of unspecified metatarsal bone(s), unspecified foot, initial encounter for closed fracture: Secondary | ICD-10-CM | POA: Insufficient documentation

## 2012-04-09 DIAGNOSIS — M79609 Pain in unspecified limb: Secondary | ICD-10-CM

## 2012-04-09 DIAGNOSIS — X58XXXA Exposure to other specified factors, initial encounter: Secondary | ICD-10-CM | POA: Insufficient documentation

## 2012-04-09 DIAGNOSIS — IMO0001 Reserved for inherently not codable concepts without codable children: Secondary | ICD-10-CM

## 2012-04-09 DIAGNOSIS — S92301A Fracture of unspecified metatarsal bone(s), right foot, initial encounter for closed fracture: Secondary | ICD-10-CM

## 2012-04-09 DIAGNOSIS — S92351G Displaced fracture of fifth metatarsal bone, right foot, subsequent encounter for fracture with delayed healing: Secondary | ICD-10-CM

## 2012-04-09 MED ORDER — OXYCODONE-ACETAMINOPHEN 5-325 MG PO TABS: 1.0000 | ORAL_TABLET | Freq: Four times a day (QID) | ORAL | Status: DC | PRN

## 2012-04-09 NOTE — Patient Instructions (Addendum)
We will refer you to a foot/ankle specialist for surgical opinion given lack of healing after 4 weeks and widening at the fracture site from your initial x-rays. Out of work in the meantime.  Take tylenol 500mg  1-2 tabs three times a day for pain. Aleve 1-2 tabs twice a day with food Glucosamine sulfate 750mg  twice a day is a supplement that has been shown to help moderate to severe arthritis. Capsaicin topically up to four times a day may also help with pain. Consider physical therapy to strengthen muscles around the joint that hurts to take pressure off of the joint itself. Heat or ice 15 minutes at a time 3-4 times a day as needed to help with pain.

## 2012-04-10 ENCOUNTER — Encounter: Payer: Self-pay | Admitting: Family Medicine

## 2012-04-10 NOTE — Progress Notes (Signed)
Subjective:    Patient ID: Anna Crawford, female    DOB: Aug 26, 1960, 52 y.o.   MRN: [id number]  PCP: Sandford Craze  HPI  52 yo F here for f/u right foot injury.  3/5: Patient reports on 3/2 she was out walking her dog, talking to someone when the dog darted to the right (saw another dog) causing her to invert her right foot and fall to left side onto wrist, left knee. Could only bear weight if walking on right heel. + swelling and bruising. No prior right foot injuries (maybe sprained this ankle before). Went to ED - put in a posterior splint with crutches. Has been icing, elevating, taking oxycodone.  3/19: Patient returns with continued pain at area of right base 5th metatarsal fracture. Taking oxycodone as needed for pain. Has been elevating. Wearing cam walker and using either crutches or a walker to help get around. Icing some initially.  4/2:   Patient returns stating she does feel better since last visit. Still pain to press on area of fracture and gets achy at times. Swelling has improved. Using cam walker and crutches or walker to get around. Taking percocet as needed for pain. Has been out of work.  Past Medical History  Diagnosis Date  . Arthritis   . Degenerative disc disease   . History of chicken pox   . Anxiety   . GERD (gastroesophageal reflux disease)     Current Outpatient Prescriptions on File Prior to Visit  Medication Sig Dispense Refill  . albuterol (PROVENTIL HFA;VENTOLIN HFA) 108 (90 BASE) MCG/ACT inhaler Inhale 2 puffs into the lungs every 6 (six) hours as needed. For shortness of breath patient has not used as of 08-08-11      . ALPRAZolam (XANAX) 0.25 MG tablet Take 1 tablet (0.25 mg total) by mouth 2 (two) times daily as needed for sleep.  180 tablet  0  . Calcium Carbonate-Vitamin D (CALTRATE 600+D) 600-400 MG-UNIT per tablet Take 1 tablet by mouth 2 (two) times daily.      . Cyanocobalamin 500 MCG/0.1ML SOLN One spray to one nostril  once weekly.  1 Bottle  2  . cyclobenzaprine (FLEXERIL) 5 MG tablet Take 1 tablet (5 mg total) by mouth at bedtime.  90 tablet  1  . omeprazole (PRILOSEC) 40 MG capsule Take 1 capsule (40 mg total) by mouth daily.  90 capsule  1  . PARoxetine (PAXIL-CR) 25 MG 24 hr tablet Take 1 tablet (25 mg total) by mouth every morning.  90 tablet  1   No current facility-administered medications on file prior to visit.    Past Surgical History  Procedure Laterality Date  . Cholecystectomy  1998  . Breast surgery  1997    right breast biopsy--benign  . Mandible osteotomy  1982  . Ankle fracture surgery      No Known Allergies  History   Social History  . Marital Status: Divorced    Spouse Name: N/A    Number of Children: 0  . Years of Education: N/A   Occupational History  .  [employer]Duty   Social History Main Topics  . Smoking status: Current Every Day Smoker -- 1.00 packs/day for 18 years    Types: Cigarettes  . Smokeless tobacco: Never Used  . Alcohol Use: Yes     Comment: social  . Drug Use: No  . Sexually Active: Not on file   Other Topics Concern  . Not on file   Social History  Narrative   Regular exercise:  No   Caffeine Use:  2 cups coffee daily   Lives with Mom   No children   [employer]    Family History  Problem Relation Age of Onset  . Cancer Mother     history of breast  . Arthritis Mother   . Hyperlipidemia Mother   . Hypertension Mother   . Cancer Father     lung  . Hypertension Father   . Hyperlipidemia Brother   . Hypertension Brother   . Diabetes Brother   . Thyroid disease Paternal Aunt   . Arthritis Paternal Grandmother   . Thyroid disease Paternal Grandmother   . Heart attack Neg Hx     BP 143/96  Pulse 84  Ht 5\' 5"  (1.651 m)  Wt 160 lb (72.576 kg)  BMI 26.63 kg/m2  Review of Systems  See HPI above.    Objective:   Physical Exam  Gen: NAD  R foot/ankle: Mild swelling, no bruising lateral foot/ankle.  No other  deformity. Did not test ROM, strength today. Still moderate TTP at base 5th metatarsal.  No other TTP foot, ankle. Negative ant drawer.   Negative syndesmotic compression. Thompsons test negative. NV intact distally.     Assessment & Plan:  1. Right 5th metatarsal avulsion fracture - Patient now a month out with persistent pain, widening beyond 3mm at fracture site (poor prognostic sign for healing), and to date no callus formation.  We discussed her options at this point: 1. Continue conservative management to full 6 weeks from injury (2 more weeks)  2. Referral to foot/ankle specialist to further review surgical vs. nonsurgical options.  We both prefer the 2nd option.  Advised most physicians at this point would recommend surgical intervention when displacement more than 3 mm though she could have late healing or a fibrous nonunion develop and do well conservatively.  Regardless will refer to Dr. Lajoyce Corners - appointment set up for next week for further evaluation and management options.  Continue cam walker, crutches.  Out of work.  Oxycodone as needed for pain.

## 2012-04-10 NOTE — Assessment & Plan Note (Signed)
Patient now a month out with persistent pain, widening beyond 3mm at fracture site (poor prognostic sign for healing), and to date no callus formation.  We discussed her options at this point: 1. Continue conservative management to full 6 weeks from injury (2 more weeks)  2. Referral to foot/ankle specialist to further review surgical vs. nonsurgical options.  We both prefer the 2nd option.  Advised most physicians at this point would recommend surgical intervention when displacement more than 3 mm though she could have late healing or a fibrous nonunion develop and do well conservatively.  Regardless will refer to Dr. Lajoyce Corners - appointment set up for next week for further evaluation and management options.  Continue cam walker, crutches.  Out of work.  Oxycodone as needed for pain.

## 2012-05-07 ENCOUNTER — Other Ambulatory Visit: Payer: Self-pay | Admitting: Family Medicine

## 2012-05-07 NOTE — Telephone Encounter (Signed)
I can only send 30 day supply,  Will see if Dr. Abner Greenspan is comfortable signing.  Thanks

## 2012-05-07 NOTE — Telephone Encounter (Signed)
Alprazolam request [Last Rx 02.03.13 #[id number] to mail order]/SLS Please advise.

## 2012-06-10 ENCOUNTER — Other Ambulatory Visit: Payer: Self-pay | Admitting: Family Medicine

## 2012-06-10 NOTE — Telephone Encounter (Signed)
She can have one 90 days supply without any further refills til seen

## 2012-06-10 NOTE — Telephone Encounter (Signed)
Please advise Xanax refill? Last RX was wrote on 05-07-12 quantity 180 with 0 refills.  If ok fax to [phone]

## 2012-06-10 NOTE — Telephone Encounter (Signed)
Medication phoned in. Pt notified that she would need an appt to receive further refills. Stated she will call the office tomorrow to schedule.

## 2012-06-10 NOTE — Telephone Encounter (Signed)
Dr. Abner Greenspan are you comfortable refilling for 3 month supply?    Bethann Berkshire she needs follow up in next 1-2 months please.

## 2012-09-09 ENCOUNTER — Other Ambulatory Visit: Payer: Self-pay | Admitting: Family

## 2012-10-12 ENCOUNTER — Other Ambulatory Visit: Payer: Self-pay | Admitting: Family

## 2012-10-13 NOTE — Telephone Encounter (Signed)
Left message for patient to return my call.

## 2012-10-13 NOTE — Telephone Encounter (Signed)
Refills sent to pharmacy for omeprazole and paxil. Pt was due for follow up in May and did not schedule appt. Please call pt to arrange appt soon as further refills cannot be given until she is seen.

## 2012-10-14 NOTE — Telephone Encounter (Signed)
Left message for patient to return my call.

## 2012-10-14 NOTE — Telephone Encounter (Signed)
Pt returned call and scheduled follow up for 10/21/12 at 10:30am.

## 2012-10-21 ENCOUNTER — Ambulatory Visit (INDEPENDENT_AMBULATORY_CARE_PROVIDER_SITE_OTHER): Payer: BC Managed Care – PPO | Admitting: Family

## 2012-10-21 ENCOUNTER — Encounter: Payer: Self-pay | Admitting: Family

## 2012-10-21 VITALS — BP 124/88 | HR 100 | Temp 98.2°F | Resp 16 | Ht 65.5 in | Wt 172.0 lb

## 2012-10-21 DIAGNOSIS — F411 Generalized anxiety disorder: Secondary | ICD-10-CM

## 2012-10-21 DIAGNOSIS — Z23 Encounter for immunization: Secondary | ICD-10-CM

## 2012-10-21 MED ORDER — ALPRAZOLAM 0.25 MG PO TABS: ORAL_TABLET | ORAL | Status: DC

## 2012-10-21 MED ORDER — PAROXETINE HCL ER 37.5 MG PO TB24: 37.5000 mg | ORAL_TABLET | ORAL | Status: DC

## 2012-10-21 NOTE — Assessment & Plan Note (Signed)
Deteriorated.  Increase paxil from 25mg  to 37.5 mg.  Follow up in 6 weeks. We discussed trying to find a less stressful position either within her company or with a different company.  Discussed adding an exercise regimen for stress relief.  Consider therapy referral next visit if not improved.

## 2012-10-21 NOTE — Progress Notes (Signed)
Subjective:    Patient ID: Anna Crawford, female    DOB: Oct 03, 1960, 52 y.o.   MRN: [id number]  HPI  Anna Crawford is a 52 yr old female who presents today to discuss anxiety. She is currently maintained on paxil cr 25mg  once daily and prn alprazolam which she is generally only using at night to help with sleep. She reports that her anxiety level remains high. She believes that this is related to her stressful job.   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  She reports occasional panic attacks.  She recently had a foot fracture and took some time off of work and reports that she "felt the best I have felt in a long time."  Feels obligated to keep working as she is financially responsible for her mother and her brother who is mentally disabled.   Review of Systems See HPI  Past Medical History  Diagnosis Date  . Arthritis   . Degenerative disc disease   . History of chicken pox   . Anxiety   . GERD (gastroesophageal reflux disease)     History   Social History  . Marital Status: Divorced    Spouse Name: N/A    Number of Children: 0  . Years of Education: N/A   Occupational History  .  [employer]uty   Social History Main Topics  . Smoking status: Current Every Day Smoker -- 1.00 packs/day for 18 years    Types: Cigarettes  . Smokeless tobacco: Never Used  . Alcohol Use: Yes     Comment: social  . Drug Use: No  . Sexual Activity: Not on file   Other Topics Concern  . Not on file   Social History Narrative   Regular exercise:  No   Caffeine Use:  2 cups coffee daily   Lives with Mom   No children   [employer]    Past Surgical History  Procedure Laterality Date  . Cholecystectomy  1998  . Breast surgery  1997    right breast biopsy--benign  . Mandible osteotomy  1982  . Ankle fracture surgery      Family History  Problem Relation Age of Onset  . Cancer Mother     history of breast  . Arthritis Mother   . Hyperlipidemia Mother   .  Hypertension Mother   . Cancer Father     lung  . Hypertension Father   . Hyperlipidemia Brother   . Hypertension Brother   . Diabetes Brother   . Thyroid disease Paternal Aunt   . Arthritis Paternal Grandmother   . Thyroid disease Paternal Grandmother   . Heart attack Neg Hx     No Known Allergies  Current Outpatient Prescriptions on File Prior to Visit  Medication Sig Dispense Refill  . albuterol (PROVENTIL HFA;VENTOLIN HFA) 108 (90 BASE) MCG/ACT inhaler Inhale 2 puffs into the lungs every 6 (six) hours as needed. For shortness of breath patient has not used as of 08-08-11      . Calcium Carbonate-Vitamin D (CALTRATE 600+D) 600-400 MG-UNIT per tablet Take 1 tablet by mouth 2 (two) times daily.      . Cyanocobalamin 500 MCG/0.1ML SOLN One spray to one nostril once weekly.  1 Bottle  2  . cyclobenzaprine (FLEXERIL) 5 MG tablet TAKE 1 TABLET BY MOUTH EVERY NIGHT AT BEDTIME  90 tablet  0  . omeprazole (PRILOSEC) 40 MG capsule TAKE 1 CAPSULE BY MOUTH ONCE DAILY  90 capsule  0  . oxyCODONE-acetaminophen (PERCOCET/ROXICET) 5-325 MG per tablet Take 1 tablet by mouth every 6 (six) hours as needed for pain.  60 tablet  0   No current facility-administered medications on file prior to visit.    BP 124/88  Pulse 100  Temp(Src) 98.2 F (36.8 C) (Oral)  Resp 16  Ht 5' 5.5" (1.664 m)  Wt 172 lb (78.019 kg)  BMI 28.18 kg/m2  SpO2 99%       Objective:   Physical Exam  Constitutional: She is oriented to person, place, and time. She appears well-developed and well-nourished. No distress.  HENT:  Head: Normocephalic and atraumatic.  Neurological: She is alert and oriented to person, place, and time.  Psychiatric: She has a normal mood and affect. Her behavior is normal. Judgment and thought content normal.          Assessment & Plan:

## 2012-10-21 NOTE — Patient Instructions (Signed)
Increase paxil to 37.5 mg once daily. Please follow up in 6 weeks.

## 2012-11-22 ENCOUNTER — Other Ambulatory Visit: Payer: Self-pay | Admitting: Family

## 2012-11-24 NOTE — Telephone Encounter (Signed)
Pt has follow up 10/15/12.  Please advise refill request below:  Requested Medications    Medication name:  Name from pharmacy:  ALPRAZolam (XANAX) 0.25 MG tablet  ALPRAZOLAM 0.25MG  TABLETS   Sig: TAKE 1 TABLET BY MOUTH TWICE DAILY AS NEEDED   Dispense: 60 tablet Refills: 0 Start: 11/22/2012  Class: Normal   Requested on: 11/22/2012   Originally ordered on: 07/31/2010 Last refill: 10/24/2012

## 2012-11-24 NOTE — Telephone Encounter (Signed)
Rx request phoned to pharmacy/SLS  

## 2012-11-24 NOTE — Telephone Encounter (Signed)
OK to send #60 tabs.

## 2012-12-12 ENCOUNTER — Telehealth: Payer: Self-pay | Admitting: Family

## 2012-12-12 NOTE — Telephone Encounter (Signed)
Patient states that paxil is working and did not feel the need to keep 12/15/12 appointment with Melissa. I explained to her that Efraim Kaufmann may feel that she needs to be seen and if so we will call her to reschedule appointment.

## 2012-12-15 ENCOUNTER — Ambulatory Visit: Payer: BC Managed Care – PPO | Admitting: Family

## 2012-12-15 NOTE — Telephone Encounter (Signed)
Noted  

## 2012-12-27 ENCOUNTER — Other Ambulatory Visit: Payer: Self-pay | Admitting: Family

## 2012-12-28 ENCOUNTER — Other Ambulatory Visit: Payer: Self-pay | Admitting: Family

## 2012-12-28 NOTE — Telephone Encounter (Signed)
Ok to send 60 tabs with zero refills.  

## 2012-12-29 NOTE — Telephone Encounter (Signed)
eScribe request for refill on Alprazolam Last filled - 11.15.14, #60x0 eScribe request for refill on Cyclobenzaprine Last filled - 09.02.14, #90x0 eScribe request for refill on Omeprazole [Early request] Last filled - 10.05.14, #90x0 Last AEX - 10.14.14 Next AEX - 6 Weeks [Canceled 12.0.14 appt, no future appt scheduled] Please Advise/SLS

## 2012-12-29 NOTE — Telephone Encounter (Signed)
Rx request phoned to pharmacy/SLS  

## 2012-12-30 NOTE — Telephone Encounter (Signed)
OK to send refills as below.  

## 2012-12-31 NOTE — Telephone Encounter (Signed)
Rxs printed and faxed to pharmacy.  

## 2013-01-08 HISTORY — PX: CYSTECTOMY: SUR359

## 2013-01-26 ENCOUNTER — Other Ambulatory Visit: Payer: Self-pay | Admitting: Family

## 2013-01-26 NOTE — Telephone Encounter (Signed)
Ok to give 2 week supply but needs OV please

## 2013-01-26 NOTE — Telephone Encounter (Signed)
Please schedule an OV per [facility] for pt. Only a 2 week supply was sent into the pharmacy

## 2013-01-26 NOTE — Telephone Encounter (Signed)
30 day supply paxil sent to pharmacy.  Pt cancelled follow up in December and will need to be seen before further refills can be sent.  Please call pt to arrange appt.

## 2013-01-27 NOTE — Telephone Encounter (Signed)
Patient states that she does not want to come back in for a medication that she has been on for years. She states that she is doing fine with this medication and would like to talk to a nurse or Melissa about this. I did inform her that Melissa only filled a two week supply

## 2013-01-27 NOTE — Telephone Encounter (Signed)
Please call pt and let her know that her dosage was changed last visit in October and that it is standard practice that we see pt's back after med change or dose change in 4-6 weeks. After follow up if stable, we can again space out her appointments.

## 2013-01-27 NOTE — Telephone Encounter (Signed)
Notified pt. Pt became upset stating she notified us in November that she was feeling fine on her current dose when she cancelled her original follow up. Pt wants to know why we are now telling her she needs to be seen. I advised her per recommendations below and that different Providers treat differently. Pt wanted to know if this was because of Provider's licensing and I advised her it was not due to that but some are comfortable seeing pts at longer intervals instead of close follow up but the below recommendation stands. Pt wanted to know if she need to find another Provider. I advised her that we would prefer that she not but that is her right if she feels someone else would be a better fit for her healthcare needs.  Pt remains very upset and states that she will think over the recommendations and will call us if she feels she needs to be seen.

## 2013-02-28 ENCOUNTER — Other Ambulatory Visit: Payer: Self-pay | Admitting: Family

## 2013-03-01 IMAGING — CT CT HEART MORP W/ CTA COR W/ SCORE W/ CA W/CM &/OR W/O CM
1 of 12 series · 3 of 20 positions shown, 4 images · IV contrast (CONTRAST)
Comparison: No priors.

INDICATION: 51-year-old female with history of chest heaviness.

CT ANGIOGRAPHY OF THE HEART, CORONARY ARTERY, STRUCTURE, AND
MORPHOLOGY
CONTRAST: 80mL OMNIPAQUE IOHEXOL 350 MG/ML SOLN
TECHNIQUE: CT angiography of the coronary vessels was performed on
a 256 channel system using prospective ECG gating.  A scout and
noncontrast exam (for calcium scoring) were performed.  Circulation
time was measured using a test bolus.  Coronary CTA was performed
with sub mm slice collimation during portions of the cardiac cycle
after prior injection of iodinated contrast.  Imaging post
processing was performed on an independent workstation creating
multiplanar and 3-D images, and quantitative analysis of the heart
and coronary arteries.  Note that this exam targets the heart and
the chest was not imaged in its entirety.
PREMEDICATION:
Lopressor 200 mg, P.O.
Lopressor 5 mg, IV
Nitroglycerin 400 mcg, sublingual.

[Series 12: w/o ec, 79.0% · axial · non-contrast · 0.49mm/px · z∈[-278,-138]mm · 3 of 350 slices shown, 4 images]
[im 1/350  vessel]
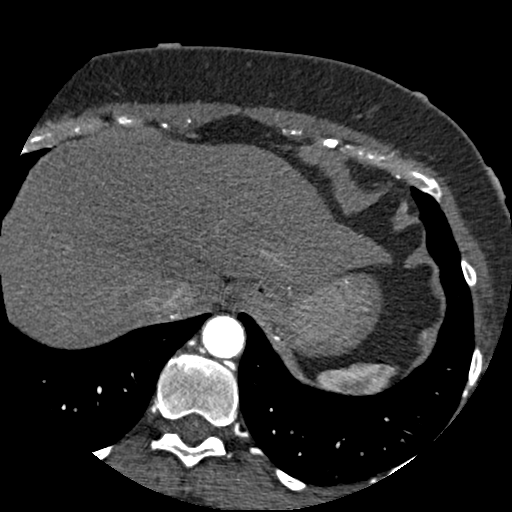
[im 1/350  lung]
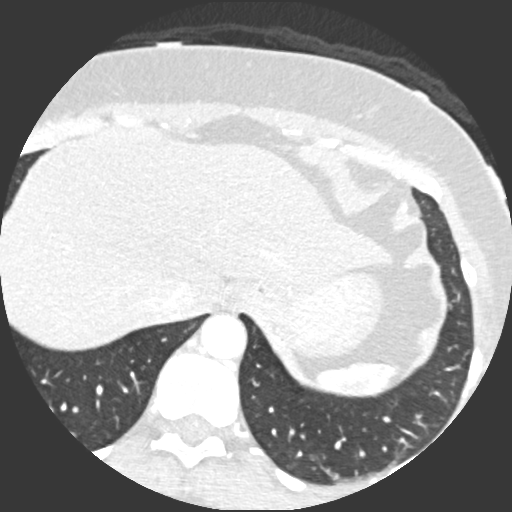
[im 175/350  vessel]
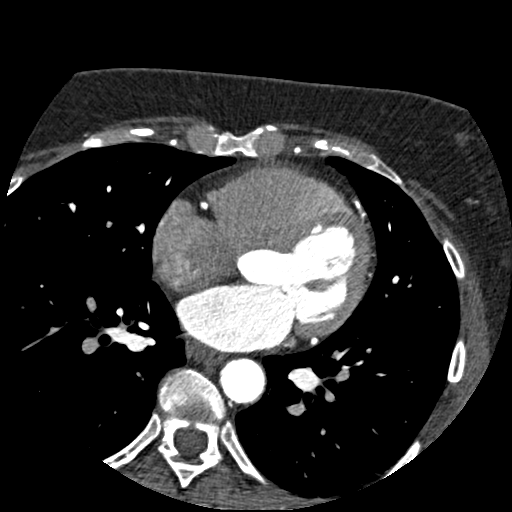
[im 350/350  vessel]
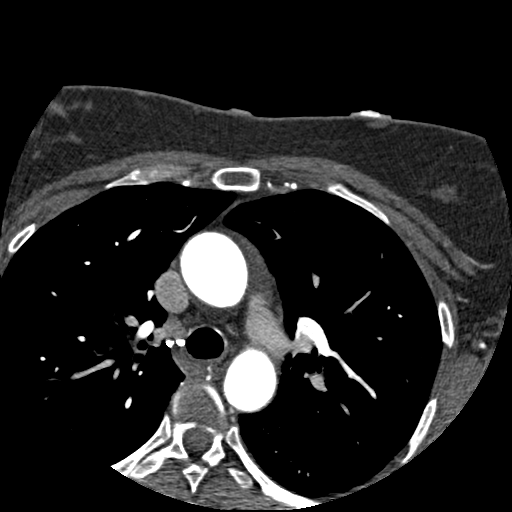

[3 of 20 positions shown; findings below may reference images not displayed]

FINDINGS: Technical quality:  Excellent

Heart rate:  55

CORONARY ARTERIES:
Left main coronary artery:  Negative.
Left anterior descending:  Negative.
Left circumflex:  Negative.
Right coronary artery:  Negative.
Posterior descending artery:  Patent.
Dominance:  Right.

CORONARY CALCIUM:
Total Agatston Score:  0
[HOSPITAL] percentile:  N/A

AORTA AND PULMONARY MEASUREMENTS:
Aortic root (21 - 40 mm):
            22 mm  at the annulus
            30 mm  at the sinuses of Valsalva
            25 mm  at the sinotubular junction
Ascending aorta ( <  40 mm):  36 mm
Descending aorta ( <  40 mm):  23 mm
Main pulmonary artery:  ( <  30 mm):  26 mm

EXTRACARDIAC FINDINGS:
Within the visualized portions of the thorax there are no definite
suspicious appearing pulmonary nodules or masses.  No consolidative
airspace disease.  No pleural effusions.  Visualized portions of
the upper abdomen are unremarkable. There are no aggressive
appearing lytic or blastic lesions noted in the visualized portions
of the skeleton.
IMPRESSION: 1.  No evidence of significant coronary artery disease.  The
patient's total coronary artery calcium score is 0.
2.  No acute findings in the visualized thorax to account for the
patient's symptoms.
3.  Right coronary artery dominance.

08/09/2011..

## 2013-03-02 ENCOUNTER — Encounter: Payer: Self-pay | Admitting: Family

## 2013-03-02 ENCOUNTER — Ambulatory Visit (INDEPENDENT_AMBULATORY_CARE_PROVIDER_SITE_OTHER): Payer: BC Managed Care – PPO | Admitting: Family

## 2013-03-02 VITALS — BP 148/100 | HR 101 | Temp 98.6°F | Resp 18 | Ht 65.5 in | Wt 177.1 lb

## 2013-03-02 DIAGNOSIS — E538 Deficiency of other specified B group vitamins: Secondary | ICD-10-CM

## 2013-03-02 DIAGNOSIS — R635 Abnormal weight gain: Secondary | ICD-10-CM

## 2013-03-02 DIAGNOSIS — F411 Generalized anxiety disorder: Secondary | ICD-10-CM

## 2013-03-02 DIAGNOSIS — I10 Essential (primary) hypertension: Secondary | ICD-10-CM

## 2013-03-02 LAB — BASIC METABOLIC PANEL WITH GFR
BUN: 11 mg/dL (ref 6–23)
CHLORIDE: 101 meq/L (ref 96–112)
CO2: 28 mEq/L (ref 19–32)
Calcium: 10 mg/dL (ref 8.4–10.5)
Creat: 0.71 mg/dL (ref 0.50–1.10)
GFR, Est African American: >89 mL/min
GLUCOSE: 93 mg/dL (ref 70–99)
POTASSIUM: 4.5 meq/L (ref 3.5–5.3)
SODIUM: 137 meq/L (ref 135–145)

## 2013-03-02 MED ORDER — PAROXETINE HCL ER 37.5 MG PO TB24: ORAL_TABLET | ORAL | Status: DC

## 2013-03-02 MED ORDER — ALPRAZOLAM 0.25 MG PO TABS: ORAL_TABLET | ORAL | Status: DC

## 2013-03-02 MED ORDER — CYCLOBENZAPRINE HCL 5 MG PO TABS: ORAL_TABLET | ORAL | Status: DC

## 2013-03-02 MED ORDER — OMEPRAZOLE 40 MG PO CPDR: DELAYED_RELEASE_CAPSULE | ORAL | Status: DC

## 2013-03-02 MED ORDER — AMLODIPINE BESYLATE 5 MG PO TABS: 5.0000 mg | ORAL_TABLET | Freq: Every day | ORAL | Status: DC

## 2013-03-02 NOTE — Patient Instructions (Signed)
Please complete your lab work prior to leaving. Start amlodipine 5mg  once daily. Work on [employer], exercise, weight loss. Follow up in 1 month.

## 2013-03-02 NOTE — Assessment & Plan Note (Addendum)
Anxiety is well controlled with paxil cr and prn xanax. Continue current dose of paxil.

## 2013-03-02 NOTE — Assessment & Plan Note (Signed)
She was given coupon for cyanocobalamin. Will check b12 level and see if this needs to be resumed.

## 2013-03-02 NOTE — Assessment & Plan Note (Signed)
Will obtain TSH. Discussed healthy diet, exercise, weight loss.

## 2013-03-02 NOTE — Progress Notes (Signed)
Pre visit review using our clinic review tool, if applicable. No additional management support is needed unless otherwise documented below in the visit note. 

## 2013-03-02 NOTE — Progress Notes (Signed)
Subjective:    Patient ID: Anna Crawford, female    DOB: 04-15-1960, 53 y.o.   MRN: [id number]  HPI  Anna Crawford is a 53 yr old female who presents today for follow up.  1) Anxiety- Last visit she noted increased anxiety. Paxil was increased from 25 to 37.5. She does report some weight gain. Also notes thinning of hair.  Wt Readings from Last 3 Encounters:  03/02/13 177 lb 1.9 oz (80.341 kg)  10/21/12 172 lb (78.019 kg)  04/09/12 160 lb (72.576 kg)  Body mass index is 29.02 kg/(m^2).  2) HTN-   BP Readings from Last 3 Encounters:  03/02/13 148/100  10/21/12 124/88  04/09/12 143/96   3) B12 deficiency- last b12 level was low normal 1 year ago.  Has not been using cyanocobalamin nasal spray due to cost.    Review of Systems See HPI  Past Medical History  Diagnosis Date  . Arthritis   . Degenerative disc disease   . History of chicken pox   . Anxiety   . GERD (gastroesophageal reflux disease)     History   Social History  . Marital Status: Divorced    Spouse Name: N/A    Number of Children: 0  . Years of Education: N/A   Occupational History  .  [employer] History Main Topics  . Smoking status: Current Every Day Smoker -- 1.00 packs/day for 18 years    Types: Cigarettes  . Smokeless tobacco: Never Used  . Alcohol Use: Yes     Comment: social  . Drug Use: No  . Sexual Activity: Not on file   Other Topics Concern  . Not on file   Social History Narrative   Regular exercise:  No   Caffeine Use:  2 cups coffee daily   Lives with Mom   No children   [employer]    Past Surgical History  Procedure Laterality Date  . Cholecystectomy  1998  . Breast surgery  1997    right breast biopsy--benign  . Mandible osteotomy  1982  . Ankle fracture surgery      Family History  Problem Relation Age of Onset  . Cancer Mother     history of breast  . Arthritis Mother   . Hyperlipidemia Mother   . Hypertension Mother   . Cancer Father    lung  . Hypertension Father   . Hyperlipidemia Brother   . Hypertension Brother   . Diabetes Brother   . Thyroid disease Paternal Aunt   . Arthritis Paternal Grandmother   . Thyroid disease Paternal Grandmother   . Heart attack Neg Hx     No Known Allergies  Current Outpatient Prescriptions on File Prior to Visit  Medication Sig Dispense Refill  . Calcium Carbonate-Vitamin D (CALTRATE 600+D) 600-400 MG-UNIT per tablet Take 1 tablet by mouth 2 (two) times daily.      . Cyanocobalamin 500 MCG/0.1ML SOLN One spray to one nostril once weekly.  1 Bottle  2  . albuterol (PROVENTIL HFA;VENTOLIN HFA) 108 (90 BASE) MCG/ACT inhaler Inhale 2 puffs into the lungs every 6 (six) hours as needed. For shortness of breath patient has not used as of 08-08-11       No current facility-administered medications on file prior to visit.    BP 148/100  Pulse 101  Temp(Src) 98.6 F (37 C) (Oral)  Resp 18  Ht 5' 5.5" (1.664 m)  Wt 177 lb 1.9 oz (80.341  kg)  BMI 29.02 kg/m2  SpO2 99%       Objective:   Physical Exam  Constitutional: She is oriented to person, place, and time. She appears well-developed and well-nourished. No distress.  HENT:  Head: Normocephalic and atraumatic.  Cardiovascular: Normal rate and regular rhythm.   No murmur heard. Pulmonary/Chest: Effort normal and breath sounds normal. No respiratory distress. She has no wheezes. She has no rales. She exhibits no tenderness.  Neurological: She is alert and oriented to person, place, and time.  Psychiatric: She has a normal mood and affect. Her behavior is normal. Judgment and thought content normal.          Assessment & Plan:

## 2013-03-03 ENCOUNTER — Telehealth: Payer: Self-pay | Admitting: Family

## 2013-03-03 LAB — VITAMIN B12: Vitamin B-12: 238 pg/mL (ref 211–911)

## 2013-03-03 LAB — TSH: TSH: 1.935 u[IU]/mL (ref 0.350–4.500)

## 2013-03-03 MED ORDER — VITAMIN B-12 100 MCG PO TABS: 100.0000 ug | ORAL_TABLET | Freq: Every day | ORAL | Status: DC

## 2013-03-03 NOTE — Telephone Encounter (Signed)
Please let pt know that her b12 is low normal.  I don't think she needs to restart the b12 nasal spray at this time, but I would recommend a trial of an OTC oral b12 13mcg once daily.  Lets repeat her b12 level at her follow up appointment in 1 month to see how it looks.  Thyroid function looks normal.  Kidney function and sugar are normal.

## 2013-03-03 NOTE — Telephone Encounter (Signed)
Relevant patient education assigned to patient using Emmi. ° °

## 2013-03-04 NOTE — Telephone Encounter (Signed)
No answer on home#, left message on cell# to return my call.

## 2013-03-06 NOTE — Telephone Encounter (Signed)
Notified pt and she voices understanding. 

## 2013-03-31 ENCOUNTER — Encounter: Payer: Self-pay | Admitting: Family

## 2013-03-31 ENCOUNTER — Ambulatory Visit (INDEPENDENT_AMBULATORY_CARE_PROVIDER_SITE_OTHER): Payer: BC Managed Care – PPO | Admitting: Family

## 2013-03-31 VITALS — BP 128/90 | HR 110 | Resp 16 | Ht 65.5 in | Wt 175.0 lb

## 2013-03-31 DIAGNOSIS — J309 Allergic rhinitis, unspecified: Secondary | ICD-10-CM

## 2013-03-31 DIAGNOSIS — I10 Essential (primary) hypertension: Secondary | ICD-10-CM

## 2013-03-31 MED ORDER — AMLODIPINE BESYLATE 5 MG PO TABS: 5.0000 mg | ORAL_TABLET | Freq: Every day | ORAL | Status: DC

## 2013-03-31 NOTE — Patient Instructions (Signed)
Please add claritin 10mg  once daily. Follow up in 4 months.

## 2013-03-31 NOTE — Progress Notes (Signed)
Pre visit review using our clinic review tool, if applicable. No additional management support is needed unless otherwise documented below in the visit note. 

## 2013-03-31 NOTE — Progress Notes (Signed)
Subjective:    Patient ID: Anna Crawford, female    DOB: Oct 09, 1960, 53 y.o.   MRN: [id number]  HPI  Ms. Anna Crawford is a 53 yr old female who presents today for follow up of her hypertension. Last visit blood pressure was noted to be elevated. Amlodipine was added to her regimen.  BP Readings from Last 3 Encounters:  03/31/13 128/90  03/02/13 148/100  10/21/12 124/88  she denies CP, SOB or swelling  Allergies- uses advil cold/sinus which helps    Review of Systems See HPI  Past Medical History  Diagnosis Date  . Arthritis   . Degenerative disc disease   . History of chicken pox   . Anxiety   . GERD (gastroesophageal reflux disease)     History   Social History  . Marital Status: Divorced    Spouse Name: N/A    Number of Children: 0  . Years of Education: N/A   Occupational History  .  [employer] History Main Topics  . Smoking status: Current Every Day Smoker -- 1.00 packs/day for 18 years    Types: Cigarettes  . Smokeless tobacco: Never Used  . Alcohol Use: Yes     Comment: social  . Drug Use: No  . Sexual Activity: Not on file   Other Topics Concern  . Not on file   Social History Narrative   Regular exercise:  No   Caffeine Use:  2 cups coffee daily   Lives with Mom   No children   [employer]    Past Surgical History  Procedure Laterality Date  . Cholecystectomy  1998  . Breast surgery  1997    right breast biopsy--benign  . Mandible osteotomy  1982  . Ankle fracture surgery      Family History  Problem Relation Age of Onset  . Cancer Mother     history of breast  . Arthritis Mother   . Hyperlipidemia Mother   . Hypertension Mother   . Cancer Father     lung  . Hypertension Father   . Hyperlipidemia Brother   . Hypertension Brother   . Diabetes Brother   . Thyroid disease Paternal Aunt   . Arthritis Paternal Grandmother   . Thyroid disease Paternal Grandmother   . Heart attack Neg Hx     No Known  Allergies  Current Outpatient Prescriptions on File Prior to Visit  Medication Sig Dispense Refill  . albuterol (PROVENTIL HFA;VENTOLIN HFA) 108 (90 BASE) MCG/ACT inhaler Inhale 2 puffs into the lungs every 6 (six) hours as needed. For shortness of breath patient has not used as of 08-08-11      . ALPRAZolam (XANAX) 0.25 MG tablet TAKE 1 TABLET BY MOUTH TWICE DAILY AS NEEDED  60 tablet  0  . amLODipine (NORVASC) 5 MG tablet Take 1 tablet (5 mg total) by mouth daily.  30 tablet  0  . Calcium Carbonate-Vitamin D (CALTRATE 600+D) 600-400 MG-UNIT per tablet Take 1 tablet by mouth 2 (two) times daily.      . cyclobenzaprine (FLEXERIL) 5 MG tablet TAKE 1 TABLET BY MOUTH EVERY NIGHT AT BEDTIME  30 tablet  5  . omeprazole (PRILOSEC) 40 MG capsule TAKE 1 CAPSULE BY MOUTH DAILY  30 capsule  5  . PARoxetine (PAXIL-CR) 37.5 MG 24 hr tablet TAKE 1 TABLET BY MOUTH EVERY MORNING  30 tablet  5  . vitamin B-12 (CYANOCOBALAMIN) 100 MCG tablet Take 1 tablet (100 mcg total)  by mouth daily.       No current facility-administered medications on file prior to visit.    BP 128/90  Pulse 110  Resp 16  Ht 5' 5.5" (1.664 m)  Wt 175 lb 0.6 oz (79.398 kg)  BMI 28.67 kg/m2  SpO2 96%       Objective:   Physical Exam  Constitutional: She is oriented to person, place, and time. She appears well-developed and well-nourished. No distress.  HENT:  Head: Normocephalic and atraumatic.  Cardiovascular: Normal rate and regular rhythm.   No murmur heard. Pulmonary/Chest: Effort normal and breath sounds normal. No respiratory distress. She has no wheezes. She has no rales. She exhibits no tenderness.  Musculoskeletal: She exhibits no edema.  Neurological: She is alert and oriented to person, place, and time.  Psychiatric: She has a normal mood and affect. Her behavior is normal. Judgment and thought content normal.          Assessment & Plan:

## 2013-03-31 NOTE — Assessment & Plan Note (Signed)
BP improved on amlodipine. Continue current dose of amlodipine.

## 2013-03-31 NOTE — Assessment & Plan Note (Signed)
We discussed avoiding decongestants due to HTN. Recommended claritin once daily.  If no improvement, ok to add otc flonase.

## 2013-04-01 ENCOUNTER — Telehealth: Payer: Self-pay | Admitting: Family

## 2013-04-01 NOTE — Telephone Encounter (Signed)
Relevant patient education assigned to patient using Emmi. ° °

## 2013-04-04 ENCOUNTER — Other Ambulatory Visit: Payer: Self-pay | Admitting: Family

## 2013-04-05 ENCOUNTER — Other Ambulatory Visit: Payer: Self-pay | Admitting: Family

## 2013-04-06 NOTE — Telephone Encounter (Signed)
Last alprazolam rx provided 03/02/13, #60 x no refills. Last Paroxetine rx 03/02/13, #30 x 5 refills. Denial sent for paroxetine and alprazolam called to pharmacy voicemail, #60 x no refills.

## 2013-05-03 ENCOUNTER — Other Ambulatory Visit: Payer: Self-pay | Admitting: Family

## 2013-05-04 NOTE — Telephone Encounter (Signed)
Ok to send 60 tabs zero refills.

## 2013-05-04 NOTE — Telephone Encounter (Signed)
Rx called to pharmacy voicemail as below. 

## 2013-06-07 ENCOUNTER — Other Ambulatory Visit: Payer: Self-pay | Admitting: Family

## 2013-06-08 NOTE — Telephone Encounter (Signed)
Pt has upcoming appt on 07/28/13.  Please advise  Medication name:  Name from pharmacy:  ALPRAZolam (XANAX) 0.25 MG tablet  ALPRAZOLAM 0.25MG  TABLETS Sig: TAKE 1 TABLET BY MOUTH TWICE DAILY AS NEEDED Dispense: 60 tablet Refills: 0 Start: 06/07/2013 Class: Normal Requested on: 06/07/2013 Originally ordered on: 07/31/2010 Last refill: 05/04/2013

## 2013-06-08 NOTE — Telephone Encounter (Signed)
OK 

## 2013-06-09 NOTE — Telephone Encounter (Signed)
Rx called to pharmacy voicemail. 

## 2013-07-15 ENCOUNTER — Other Ambulatory Visit: Payer: Self-pay | Admitting: Family

## 2013-07-16 NOTE — Telephone Encounter (Signed)
OK to send #60 tabs zero refills.  

## 2013-07-28 ENCOUNTER — Ambulatory Visit (INDEPENDENT_AMBULATORY_CARE_PROVIDER_SITE_OTHER): Payer: BC Managed Care – PPO | Admitting: Family

## 2013-07-28 VITALS — BP 136/92 | HR 111 | Temp 98.2°F | Resp 16 | Ht 65.5 in | Wt 178.1 lb

## 2013-07-28 DIAGNOSIS — F411 Generalized anxiety disorder: Secondary | ICD-10-CM

## 2013-07-28 DIAGNOSIS — I10 Essential (primary) hypertension: Secondary | ICD-10-CM

## 2013-07-28 LAB — BASIC METABOLIC PANEL
BUN: 11 mg/dL (ref 6–23)
CHLORIDE: 103 meq/L (ref 96–112)
CO2: 25 meq/L (ref 19–32)
CREATININE: 0.64 mg/dL (ref 0.50–1.10)
Calcium: 9.8 mg/dL (ref 8.4–10.5)
Glucose, Bld: 76 mg/dL (ref 70–99)
POTASSIUM: 4.2 meq/L (ref 3.5–5.3)
Sodium: 138 mEq/L (ref 135–145)

## 2013-07-28 MED ORDER — OMEPRAZOLE 40 MG PO CPDR: DELAYED_RELEASE_CAPSULE | ORAL | Status: DC

## 2013-07-28 MED ORDER — CYCLOBENZAPRINE HCL 5 MG PO TABS
ORAL_TABLET | ORAL | Status: DC
Start: 2013-07-28 — End: 2014-02-08

## 2013-07-28 MED ORDER — AMLODIPINE BESYLATE 5 MG PO TABS: 5.0000 mg | ORAL_TABLET | Freq: Every day | ORAL | Status: DC

## 2013-07-28 MED ORDER — PAROXETINE HCL ER 37.5 MG PO TB24: ORAL_TABLET | ORAL | Status: DC

## 2013-07-28 NOTE — Patient Instructions (Signed)
Please complete lab work prior to leaving.  Follow up in 6 months.  

## 2013-07-28 NOTE — Progress Notes (Signed)
Pre visit review using our clinic review tool, if applicable. No additional management support is needed unless otherwise documented below in the visit note. 

## 2013-07-28 NOTE — Progress Notes (Signed)
Subjective:    Patient ID: Anna Crawford, female    DOB: 1960/03/27, 53 y.o.   MRN: [id number]  HPI  Anna Crawford is a 53 yr old female who presents today for follow up.  1) HTN- denies swelling, CP/SOB. BP Readings from Last 3 Encounters:  07/28/13 136/92  03/31/13 128/90  03/02/13 148/100   2) Anxiety- she is maintained on xanax and paxil. She uses the xanax at night to help her sleep.  She continues paxil.   Wt Readings from Last 3 Encounters:  07/28/13 178 lb 1.9 oz (80.795 kg)  03/31/13 175 lb 0.6 oz (79.398 kg)  03/02/13 177 lb 1.9 oz (80.341 kg)      Review of Systems See HPI  Past Medical History  Diagnosis Date  . Arthritis   . Degenerative disc disease   . History of chicken pox   . Anxiety   . GERD (gastroesophageal reflux disease)     History   Social History  . Marital Status: Divorced    Spouse Name: N/A    Number of Children: 0  . Years of Education: N/A   Occupational History  .  [place] History Main Topics  . Smoking status: Current Every Day Smoker -- 1.00 packs/day for 18 years    Types: Cigarettes  . Smokeless tobacco: Never Used  . Alcohol Use: Yes     Comment: social  . Drug Use: No  . Sexual Activity: Not on file   Other Topics Concern  . Not on file   Social History Narrative   Regular exercise:  No   Caffeine Use:  2 cups coffee daily   Lives with Mom   No children   [employer]    Past Surgical History  Procedure Laterality Date  . Cholecystectomy  1998  . Breast surgery  1997    right breast biopsy--benign  . Mandible osteotomy  1982  . Ankle fracture surgery      Family History  Problem Relation Age of Onset  . Cancer Mother     history of breast  . Arthritis Mother   . Hyperlipidemia Mother   . Hypertension Mother   . Cancer Father     lung  . Hypertension Father   . Hyperlipidemia Brother   . Hypertension Brother   . Diabetes Brother   . Thyroid disease Paternal Aunt   .  Arthritis Paternal Grandmother   . Thyroid disease Paternal Grandmother   . Heart attack Neg Hx     No Known Allergies  Current Outpatient Prescriptions on File Prior to Visit  Medication Sig Dispense Refill  . ALPRAZolam (XANAX) 0.25 MG tablet TAKE 1 TABLET BY MOUTH TWICE DAILY AS NEEDED  60 tablet  0  . amLODipine (NORVASC) 5 MG tablet Take 1 tablet (5 mg total) by mouth daily.  30 tablet  3  . cyclobenzaprine (FLEXERIL) 5 MG tablet TAKE 1 TABLET BY MOUTH EVERY NIGHT AT BEDTIME  30 tablet  5  . omeprazole (PRILOSEC) 40 MG capsule TAKE 1 CAPSULE BY MOUTH DAILY  30 capsule  5  . PARoxetine (PAXIL-CR) 37.5 MG 24 hr tablet TAKE 1 TABLET BY MOUTH EVERY MORNING  30 tablet  5  . albuterol (PROVENTIL HFA;VENTOLIN HFA) 108 (90 BASE) MCG/ACT inhaler Inhale 2 puffs into the lungs every 6 (six) hours as needed. For shortness of breath patient has not used as of 08-08-11      . Calcium Carbonate-Vitamin D (CALTRATE  600+D) 600-400 MG-UNIT per tablet Take 1 tablet by mouth 2 (two) times daily.      . vitamin B-12 (CYANOCOBALAMIN) 100 MCG tablet Take 1 tablet (100 mcg total) by mouth daily.       No current facility-administered medications on file prior to visit.    BP 136/92  Pulse 111  Temp(Src) 98.2 F (36.8 C) (Oral)  Resp 16  Ht 5' 5.5" (1.664 m)  Wt 178 lb 1.9 oz (80.795 kg)  BMI 29.18 kg/m2  SpO2 97%       Objective:   Physical Exam  Constitutional: She is oriented to person, place, and time. She appears well-developed and well-nourished. No distress.  Cardiovascular: Normal rate and regular rhythm.   No murmur heard. Pulmonary/Chest: Effort normal and breath sounds normal. No respiratory distress. She has no wheezes. She has no rales. She exhibits no tenderness.  Neurological: She is alert and oriented to person, place, and time.  Psychiatric: She has a normal mood and affect. Her behavior is normal. Judgment and thought content normal.          Assessment & Plan:

## 2013-07-29 ENCOUNTER — Encounter: Payer: Self-pay | Admitting: Family

## 2013-07-30 NOTE — Assessment & Plan Note (Signed)
Stable on current meds.  Continue same. 

## 2013-07-30 NOTE — Assessment & Plan Note (Signed)
Stable on amlodipine 5mg  once daily. Continue same, obtain bmet.

## 2013-08-23 ENCOUNTER — Other Ambulatory Visit: Payer: Self-pay | Admitting: Family

## 2013-08-23 DIAGNOSIS — F419 Anxiety disorder, unspecified: Secondary | ICD-10-CM

## 2013-08-24 NOTE — Telephone Encounter (Signed)
Rx request faxed to pharmacy/SLS  

## 2013-08-24 NOTE — Telephone Encounter (Signed)
Medication refilled.  Rx Printed, signed and given to nursing for fax.

## 2013-09-24 ENCOUNTER — Other Ambulatory Visit: Payer: Self-pay | Admitting: Family

## 2013-09-25 ENCOUNTER — Other Ambulatory Visit: Payer: Self-pay | Admitting: Physician Assistant

## 2013-09-28 NOTE — Telephone Encounter (Signed)
eScribe request from [facility] for refill on Alprazolam Last filled - 08.17.15, #60x0 Last AEX - 07.21.15 Next AEX - 6 Mths. Please Advise on refills/SLS

## 2013-09-28 NOTE — Telephone Encounter (Signed)
Rx request phoned to pharmacy/SLS  

## 2013-09-28 NOTE — Telephone Encounter (Signed)
Ok to send 60 tabs zero refills.

## 2013-10-02 ENCOUNTER — Telehealth: Payer: Self-pay | Admitting: Family

## 2013-10-02 NOTE — Telephone Encounter (Signed)
Caller name: Aslynn  Relation to pt: self  Call back number: [phone] please before 3:30pm or after 4:30pm   Reason for call:   lower left back pain, arthrites in lower back. Please advise

## 2013-10-02 NOTE — Telephone Encounter (Signed)
Spoke with pt and she reports lower left side back pain since yesterday. Reports that she usually have pain all across her lower back when her arthritis flares up. Denies urinary frequency or urgency and states urine output is normal. Pt states she did have some burning with urination a few days ago. Advised pt of need for evaluation in office to determine cause of back pain. Pt voices understanding but states she cannot come in today and declines appt. She wishes to wait to schedule appt if symptoms worsen. Advised pt that she could call our [facility] if symptoms worsen over night and she voices understanding.

## 2013-10-03 NOTE — Telephone Encounter (Signed)
Noted  

## 2013-11-02 ENCOUNTER — Other Ambulatory Visit: Payer: Self-pay | Admitting: Family

## 2013-11-02 NOTE — Telephone Encounter (Signed)
Rx faxed to pharmacy  

## 2013-11-02 NOTE — Telephone Encounter (Signed)
Rx printed and forwarded to PA for signature.  Medication name:  Name from pharmacy:  ALPRAZolam (XANAX) 0.25 MG tablet  ALPRAZOLAM 0.25MG  TABLETS Sig: TAKE 1 TABLET BY MOUTH TWICE DAILY AS NEEDED Dispense: 60 tablet Refills: 0 Start: 11/02/2013 Class: Normal Requested on: 11/02/2013 Originally ordered on: 07/31/2010 Last refill: 09/28/2013

## 2013-12-08 ENCOUNTER — Other Ambulatory Visit: Payer: Self-pay | Admitting: Physician Assistant

## 2013-12-08 NOTE — Telephone Encounter (Signed)
Medication Detail      Disp Refills Start End     ALPRAZolam (XANAX) 0.25 MG tablet 60 tablet 0 11/02/2013     Sig: TAKE 1 TABLET BY MOUTH TWICE DAILY AS NEEDED    Class: Print     Pharmacy    [id number] - [place], [place] - [address] AT [place]   Last OV: 07.21.15 F/U: 6 months Please Advise on refills/SLS

## 2013-12-08 NOTE — Telephone Encounter (Signed)
ok 

## 2013-12-09 NOTE — Telephone Encounter (Signed)
Refill called to pharmacy voicemail as below. 

## 2013-12-28 ENCOUNTER — Ambulatory Visit (INDEPENDENT_AMBULATORY_CARE_PROVIDER_SITE_OTHER): Payer: BC Managed Care – PPO

## 2013-12-28 DIAGNOSIS — Z23 Encounter for immunization: Secondary | ICD-10-CM

## 2013-12-28 NOTE — Progress Notes (Signed)
Pt tolerated injection well.  No signs of a reaction upon leaving the clinic.   

## 2014-01-05 ENCOUNTER — Other Ambulatory Visit: Payer: Self-pay | Admitting: Family

## 2014-01-06 NOTE — Telephone Encounter (Signed)
Last filled:  12.2.15 Amt:  60, 0 refill Last OV:  7.21.15 No UDS or contract   Please advise.

## 2014-01-19 ENCOUNTER — Ambulatory Visit: Payer: BC Managed Care – PPO | Admitting: Family

## 2014-01-22 ENCOUNTER — Other Ambulatory Visit: Payer: Self-pay | Admitting: Ophthalmology

## 2014-02-01 ENCOUNTER — Ambulatory Visit: Payer: Self-pay | Admitting: Family

## 2014-02-08 ENCOUNTER — Ambulatory Visit (INDEPENDENT_AMBULATORY_CARE_PROVIDER_SITE_OTHER): Payer: BLUE CROSS/BLUE SHIELD | Admitting: Family

## 2014-02-08 ENCOUNTER — Encounter: Payer: Self-pay | Admitting: Family

## 2014-02-08 VITALS — BP 124/86 | HR 109 | Temp 98.3°F | Resp 18 | Ht 65.5 in | Wt 181.4 lb

## 2014-02-08 DIAGNOSIS — K219 Gastro-esophageal reflux disease without esophagitis: Secondary | ICD-10-CM

## 2014-02-08 DIAGNOSIS — F411 Generalized anxiety disorder: Secondary | ICD-10-CM

## 2014-02-08 DIAGNOSIS — I10 Essential (primary) hypertension: Secondary | ICD-10-CM

## 2014-02-08 MED ORDER — OMEPRAZOLE 40 MG PO CPDR: DELAYED_RELEASE_CAPSULE | ORAL | Status: DC

## 2014-02-08 MED ORDER — AMLODIPINE BESYLATE 5 MG PO TABS: 5.0000 mg | ORAL_TABLET | Freq: Every day | ORAL | Status: DC

## 2014-02-08 MED ORDER — CYCLOBENZAPRINE HCL 5 MG PO TABS: ORAL_TABLET | ORAL | Status: DC

## 2014-02-08 MED ORDER — ESCITALOPRAM OXALATE 20 MG PO TABS: 20.0000 mg | ORAL_TABLET | Freq: Every day | ORAL | Status: DC

## 2014-02-08 MED ORDER — ALPRAZOLAM 0.25 MG PO TABS: 0.2500 mg | ORAL_TABLET | Freq: Two times a day (BID) | ORAL | Status: DC | PRN

## 2014-02-08 NOTE — Assessment & Plan Note (Signed)
Stable, continue PPI

## 2014-02-08 NOTE — Assessment & Plan Note (Signed)
BP stable on current meds. Continue same.  

## 2014-02-08 NOTE — Patient Instructions (Addendum)
Stop paxil, start lexapro.   Follow up in 6 months.

## 2014-02-08 NOTE — Assessment & Plan Note (Signed)
Uncontrolled. D/c paxil, trial of lexapro.  Follow up in 4-6 weeks.

## 2014-02-08 NOTE — Progress Notes (Signed)
   Subjective:    Patient ID: Anna Crawford, female    DOB: Aug 25, 1960, 54 y.o.   MRN: [id number]  HPI  Patient here for follow up.  Patient is currently maintained on the following medications for blood pressure: amlodipine 5 Patient reports good compliance with blood pressure medications. Patient denies chest pain, shortness of breath or swelling. Notes mild spasms in esophagus. Last 3 blood pressure readings in our office are as follows: BP Readings from Last 3 Encounters:  02/08/14 124/86  07/28/13 136/92  03/31/13 128/90   Anxiety- maintained on paxil. Taking a retirement [employer] fund[employer].  Does not like to go places by herself.  Partner died April 18, 2007. Does not like to go shopping. Denies SI/HI.    GERD- reports that certain foods will exacerbate (tomato sauce/sugar)   Past Medical History  Diagnosis Date  . Arthritis   . Degenerative disc disease   . History of chicken pox   . Anxiety   . GERD (gastroesophageal reflux disease)     Review of Systems  HENT:       + dry mouth       Objective:    Physical Exam  Constitutional: She appears well-developed and well-nourished.  Cardiovascular: Normal rate, regular rhythm and normal heart sounds.   No murmur heard. Pulmonary/Chest: Effort normal and breath sounds normal. No respiratory distress. She has no wheezes.  Psychiatric: She has a normal mood and affect. Her behavior is normal. Judgment and thought content normal.    BP 124/86 mmHg  Pulse 109  Temp(Src) 98.3 F (36.8 C) (Oral)  Resp 18  Ht 5' 5.5" (1.664 m)  Wt 181 lb 6.4 oz (82.283 kg)  BMI 29.72 kg/m2  SpO2 99% Wt Readings from Last 3 Encounters:  02/08/14 181 lb 6.4 oz (82.283 kg)  07/28/13 178 lb 1.9 oz (80.795 kg)  03/31/13 175 lb 0.6 oz (79.398 kg)     Lab Results  Component Value Date   WBC 6.5 08/08/2011   HGB 14.5 08/08/2011   HCT 41.2 08/08/2011   PLT 239 08/08/2011   GLUCOSE 76 07/28/2013   CHOL 220* 07/26/2011   TRIG 49 07/26/2011   HDL 84  07/26/2011   LDLCALC 126* 07/26/2011   ALT 10 08/08/2011   AST 15 08/08/2011   NA 138 07/28/2013   K 4.2 07/28/2013   CL 103 07/28/2013   CREATININE 0.64 07/28/2013   BUN 11 07/28/2013   CO2 25 07/28/2013   TSH 1.935 03/02/2013    Dg Foot Complete Right  04/09/2012   *RADIOLOGY REPORT*  Clinical Data: Follow-up fifth metatarsal fracture.  RIGHT FOOT COMPLETE - 3+ VIEW  Comparison: 03/26/2012.  Findings: Continued nonunion fifth metatarsal fracture.  4 mm gap between the proximal and distal aspects of the fracture.  IMPRESSION: Nonunion fifth metatarsal fracture.   Original Report Authenticated By: Rolla Flatten, M.D.       Assessment & Plan:   Problem List Items Addressed This Visit    None       Nance Pear., NP

## 2014-03-09 ENCOUNTER — Other Ambulatory Visit: Payer: Self-pay | Admitting: Family

## 2014-03-09 NOTE — Telephone Encounter (Signed)
Rx called to pharmacy voicemail. 

## 2014-03-09 NOTE — Telephone Encounter (Signed)
Pt has f/u 03/22/14.  Please advise:  Medication name:  Name from pharmacy:  ALPRAZolam (XANAX) 0.25 MG tablet ALPRAZOLAM 0.25MG  TABLETS     Sig: TAKE 1 TABLET BY MOUTH TWICE DAILY AS NEEDED    Dispense: 60 tablet   Refills: 0   Start: 03/09/2014   Class: Normal    Requested on: 03/09/2014    Originally ordered on: 07/31/2010 02/08/2014

## 2014-03-09 NOTE — Telephone Encounter (Signed)
Ok to send 60 tabs zero refills. 

## 2014-03-22 ENCOUNTER — Ambulatory Visit: Payer: BLUE CROSS/BLUE SHIELD | Admitting: Family

## 2014-04-13 ENCOUNTER — Other Ambulatory Visit: Payer: Self-pay | Admitting: Family

## 2014-04-17 ENCOUNTER — Other Ambulatory Visit: Payer: Self-pay | Admitting: Family

## 2014-04-19 NOTE — Telephone Encounter (Signed)
Rx faxed to pharmacy @ 1:50pm.

## 2014-04-19 NOTE — Telephone Encounter (Signed)
Pt last seen 02/08/14 and has f/u 08/17/14. Last Rx given 03/09/14.  Rx printed and forwarded to PRovider for signature.  Medication name:  Name from pharmacy:  ALPRAZolam (XANAX) 0.25 MG tablet ALPRAZOLAM 0.25MG  TABLETS     Sig: TAKE 1 TABLET BY MOUTH TWICE DAILY AS NEEDED    Dispense: 60 tablet   Refills: 0   Start: 04/17/2014   Class: Normal    Requested on: 04/17/2014    Originally ordered on: 07/31/2010 03/09/2014

## 2014-05-19 ENCOUNTER — Other Ambulatory Visit: Payer: Self-pay | Admitting: Family

## 2014-05-21 NOTE — Telephone Encounter (Signed)
Pt has f/u 08/17/14. Last Rx 04/19/14.  Rx printed and forwarded to PRovider for signature.  Medication name:  Name from pharmacy:  ALPRAZolam (XANAX) 0.25 MG tablet ALPRAZOLAM 0.25MG  TABLETS     Sig: TAKE 1 TABLET BY MOUTH TWICE DAILY AS NEEDED    Dispense: 60 tablet   Refills: 0   Start: 05/19/2014   Class: Normal    Requested on: 05/19/2014    Originally ordered on: 07/31/2010 04/19/2014

## 2014-05-21 NOTE — Telephone Encounter (Signed)
Rx faxed to pharmacy  

## 2014-06-30 ENCOUNTER — Other Ambulatory Visit: Payer: Self-pay | Admitting: Family

## 2014-06-30 NOTE — Telephone Encounter (Signed)
Pt has f/u 08/17/14. No CSC or UDS on file; will need to obtain at follow up.  Rx printed and forwarded to Provider for signature.  Medication name:  Name from pharmacy:  ALPRAZolam (XANAX) 0.25 MG tablet ALPRAZOLAM 0.25MG  TABLETS     Sig: TAKE 1 TABLET BY MOUTH TWICE DAILY AS NEEDED    Dispense: 60 tablet   Refills: 0   Start: 06/30/2014   Class: Normal    Requested on: 06/30/2014    Originally ordered on: 07/31/2010 05/21/2014

## 2014-06-30 NOTE — Telephone Encounter (Signed)
Rx faxed to pharmacy at 3:15pm.

## 2014-08-03 ENCOUNTER — Other Ambulatory Visit: Payer: Self-pay | Admitting: Family

## 2014-08-03 NOTE — Telephone Encounter (Signed)
Rx faxed to pharmacy  

## 2014-08-03 NOTE — Telephone Encounter (Signed)
Last refill 06/30/14.  Pt has upcoming appt in 08/2014 and will obtain CSC and UDS at that time.  Rx printed and forwarded to PCP for signature.  Medication name:  Name from pharmacy:  ALPRAZolam (XANAX) 0.25 MG tablet ALPRAZOLAM 0.25MG  TABLETS     Sig: TAKE 1 TABLET BY MOUTH TWICE DAILY AS NEEDED    Dispense: 60 tablet   Refills: 0   Start: 08/03/2014   Class: Normal    Requested on: 08/03/2014    Originally ordered on: 07/31/2010 06/30/2014

## 2014-08-17 ENCOUNTER — Ambulatory Visit: Payer: BLUE CROSS/BLUE SHIELD | Admitting: Family

## 2014-08-30 ENCOUNTER — Other Ambulatory Visit: Payer: Self-pay | Admitting: Family

## 2014-08-30 NOTE — Telephone Encounter (Signed)
30 day supply of Lexapro sent to pharmacy. Pt is due for 6 month follow up. Please call pt to arrange appt before further refills can be given.  Thanks!

## 2014-08-30 NOTE — Telephone Encounter (Signed)
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

## 2014-09-05 ENCOUNTER — Other Ambulatory Visit: Payer: Self-pay | Admitting: Family

## 2014-09-06 NOTE — Telephone Encounter (Signed)
Rx called to pharmacy voicemail. 

## 2014-09-06 NOTE — Telephone Encounter (Signed)
OK to send 60 tabs but pt needs follow up prior to additional refills please.

## 2014-09-27 ENCOUNTER — Telehealth: Payer: Self-pay | Admitting: Family

## 2014-09-27 MED ORDER — CYCLOBENZAPRINE HCL 5 MG PO TABS: ORAL_TABLET | ORAL | Status: DC

## 2014-09-27 MED ORDER — ESCITALOPRAM OXALATE 20 MG PO TABS: 20.0000 mg | ORAL_TABLET | Freq: Every day | ORAL | Status: DC

## 2014-09-27 MED ORDER — OMEPRAZOLE 40 MG PO CPDR: DELAYED_RELEASE_CAPSULE | ORAL | Status: DC

## 2014-09-27 MED ORDER — ALPRAZOLAM 0.25 MG PO TABS: 0.2500 mg | ORAL_TABLET | Freq: Two times a day (BID) | ORAL | Status: DC | PRN

## 2014-09-27 MED ORDER — AMLODIPINE BESYLATE 5 MG PO TABS: 5.0000 mg | ORAL_TABLET | Freq: Every day | ORAL | Status: DC

## 2014-09-27 NOTE — Telephone Encounter (Signed)
Rxs placed at front desk for pick up.  Left message for pt to return my call.

## 2014-09-27 NOTE — Telephone Encounter (Signed)
Caller name:Anna Crawford Relationship to patient:SELF Can be reached:[phone] Pharmacy:[facility] [address]   Reason for call:SHE IS GETTING READY TO GO TO [place].  SOME OF HER RX'S ARE GOING TO RUN OUT WHILE SHE IS DOWN THERE.  SHE LEAVES ON Wednesday AT 600AM.  SHE WOULD LIKE TO TAKE A WRITTEN RX FOR HER AMLODIPINE, XANAX, CYCLOBENZOPRINE, LEXAPRO 20 MG, OMEPRAZOLE  PLEASE CALL THE PATIENT TO ADVISE

## 2014-09-27 NOTE — Telephone Encounter (Signed)
See rx's. Please advise pt to keep f/u in October.

## 2014-09-28 NOTE — Telephone Encounter (Signed)
Pt called this a.m. And was notified that Rxs are ready for pick up.

## 2014-10-02 ENCOUNTER — Other Ambulatory Visit: Payer: Self-pay | Admitting: Family

## 2014-10-20 ENCOUNTER — Encounter: Payer: Self-pay | Admitting: Family

## 2014-10-20 ENCOUNTER — Ambulatory Visit (INDEPENDENT_AMBULATORY_CARE_PROVIDER_SITE_OTHER): Payer: BLUE CROSS/BLUE SHIELD | Admitting: Family

## 2014-10-20 VITALS — BP 120/90 | HR 112 | Temp 98.5°F | Resp 16 | Ht 65.5 in | Wt 179.0 lb

## 2014-10-20 DIAGNOSIS — Z23 Encounter for immunization: Secondary | ICD-10-CM

## 2014-10-20 DIAGNOSIS — Z0000 Encounter for general adult medical examination without abnormal findings: Secondary | ICD-10-CM | POA: Diagnosis not present

## 2014-10-20 DIAGNOSIS — I10 Essential (primary) hypertension: Secondary | ICD-10-CM | POA: Diagnosis not present

## 2014-10-20 LAB — CBC WITH DIFFERENTIAL/PLATELET
BASOS ABS: 0 10*3/uL (ref 0.0–0.1)
Basophils Relative: 0.7 % (ref 0.0–3.0)
EOS ABS: 0 10*3/uL (ref 0.0–0.7)
Eosinophils Relative: 0.4 % (ref 0.0–5.0)
HCT: 45 % (ref 36.0–46.0)
Hemoglobin: 15 g/dL (ref 12.0–15.0)
LYMPHS ABS: 1.1 10*3/uL (ref 0.7–4.0)
Lymphocytes Relative: 14.5 % (ref 12.0–46.0)
MCHC: 33.3 g/dL (ref 30.0–36.0)
MCV: 96.6 fl (ref 78.0–100.0)
MONO ABS: 0.4 10*3/uL (ref 0.1–1.0)
Monocytes Relative: 5.2 % (ref 3.0–12.0)
NEUTROS ABS: 5.7 10*3/uL (ref 1.4–7.7)
NEUTROS PCT: 79.2 % — AB (ref 43.0–77.0)
PLATELETS: 277 10*3/uL (ref 150.0–400.0)
RBC: 4.66 Mil/uL (ref 3.87–5.11)
RDW: 12.7 % (ref 11.5–15.5)
WBC: 7.3 10*3/uL (ref 4.0–10.5)

## 2014-10-20 LAB — URINALYSIS, ROUTINE W REFLEX MICROSCOPIC
Hgb urine dipstick: NEGATIVE
Ketones, ur: 15 — AB
Leukocytes, UA: NEGATIVE
NITRITE: NEGATIVE
PH: 6 (ref 5.0–8.0)
SPECIFIC GRAVITY, URINE: 1.02 (ref 1.000–1.030)
TOTAL PROTEIN, URINE-UPE24: NEGATIVE
UROBILINOGEN UA: 0.2 (ref 0.0–1.0)
Urine Glucose: NEGATIVE

## 2014-10-20 LAB — LIPID PANEL
CHOL/HDL RATIO: 3
Cholesterol: 208 mg/dL — ABNORMAL HIGH (ref 0–200)
HDL: 80.6 mg/dL (ref >39.00)
LDL CALC: 106 mg/dL — AB (ref 0–99)
NonHDL: 127.08
TRIGLYCERIDES: 107 mg/dL (ref 0.0–149.0)
VLDL: 21.4 mg/dL (ref 0.0–40.0)

## 2014-10-20 LAB — BASIC METABOLIC PANEL
BUN: 8 mg/dL (ref 6–23)
CALCIUM: 9.9 mg/dL (ref 8.4–10.5)
CO2: 27 meq/L (ref 19–32)
CREATININE: 0.68 mg/dL (ref 0.40–1.20)
Chloride: 103 mEq/L (ref 96–112)
GFR: 95.31 mL/min (ref >60.00)
GLUCOSE: 104 mg/dL — AB (ref 70–99)
Potassium: 3.8 mEq/L (ref 3.5–5.1)
Sodium: 138 mEq/L (ref 135–145)

## 2014-10-20 LAB — HEPATIC FUNCTION PANEL
ALBUMIN: 4.6 g/dL (ref 3.5–5.2)
ALT: 27 U/L (ref 0–35)
AST: 26 U/L (ref 0–37)
Alkaline Phosphatase: 85 U/L (ref 39–117)
BILIRUBIN TOTAL: 0.4 mg/dL (ref 0.2–1.2)
Bilirubin, Direct: 0.1 mg/dL (ref 0.0–0.3)
Total Protein: 7.7 g/dL (ref 6.0–8.3)

## 2014-10-20 LAB — TSH: TSH: 1.56 u[IU]/mL (ref 0.35–4.50)

## 2014-10-20 MED ORDER — ALPRAZOLAM 0.25 MG PO TABS: 0.2500 mg | ORAL_TABLET | Freq: Two times a day (BID) | ORAL | Status: DC | PRN

## 2014-10-20 MED ORDER — SERTRALINE HCL 50 MG PO TABS: 50.0000 mg | ORAL_TABLET | Freq: Every day | ORAL | Status: DC

## 2014-10-20 NOTE — Assessment & Plan Note (Signed)
Currently stable. Has AM nausea- I suspect that this is from lexapro. She just retired and I am hesitant to d/c her SSRI right now during her transition.  Will try switching her to zoloft to see if she can better tolerate.

## 2014-10-20 NOTE — Progress Notes (Signed)
Subjective:    Patient ID: Anna Crawford, female    DOB: 11-06-1960, 54 y.o.   MRN: [id number]  HPI  Anna Crawford is a 54 yr old female who presents today for follow up.  1) HTN- She is currently maintained on amlodipine.   BP Readings from Last 3 Encounters:  10/20/14 120/90  02/08/14 124/86  07/28/13 136/92   2) GERD- maintained on omeprazole 40mg . Reports AM nausea.  Uses Gasex prn.    3) Anxiety-  Currently maintained on lexapro 20mg  once daily. She uses xanax as needed.     Review of Systems    see HPI  Past Medical History  Diagnosis Date  . Arthritis   . Degenerative disc disease   . History of chicken pox   . Anxiety   . GERD (gastroesophageal reflux disease)     Social History   Social History  . Marital Status: Divorced    Spouse Name: N/A  . Number of Children: 0  . Years of Education: N/A   Occupational History  .  [place] History Main Topics  . Smoking status: Current Every Day Smoker -- 1.00 packs/day for 18 years    Types: Cigarettes  . Smokeless tobacco: Never Used  . Alcohol Use: Yes     Comment: social  . Drug Use: No  . Sexual Activity: Not on file   Other Topics Concern  . Not on file   Social History Narrative   Regular exercise:  No   Caffeine Use:  2 cups coffee daily   Lives with Mom   No children   [employer]    Past Surgical History  Procedure Laterality Date  . Cholecystectomy  1998  . Breast surgery  1997    right breast biopsy--benign  . Mandible osteotomy  1982  . Ankle fracture surgery    . Cystectomy Left 2015    cyst removed from left eye.    Family History  Problem Relation Age of Onset  . Cancer Mother     history of breast  . Arthritis Mother   . Hyperlipidemia Mother   . Hypertension Mother   . Cancer Father     lung  . Hypertension Father   . Hyperlipidemia Brother   . Hypertension Brother   . Diabetes Brother   . Thyroid disease Paternal Aunt   . Arthritis Paternal  Grandmother   . Thyroid disease Paternal Grandmother   . Heart attack Neg Hx     No Known Allergies  Current Outpatient Prescriptions on File Prior to Visit  Medication Sig Dispense Refill  . amLODipine (NORVASC) 5 MG tablet Take 1 tablet (5 mg total) by mouth daily. 30 tablet 0  . Calcium Carbonate-Vitamin D (CALTRATE 600+D) 600-400 MG-UNIT per tablet Take 1 tablet by mouth 2 (two) times daily.    . cyclobenzaprine (FLEXERIL) 5 MG tablet TAKE 1 TABLET BY MOUTH EVERY NIGHT AT BEDTIME 30 tablet 0  . omeprazole (PRILOSEC) 40 MG capsule TAKE 1 CAPSULE BY MOUTH DAILY 30 capsule 0   No current facility-administered medications on file prior to visit.    BP 120/90 mmHg  Pulse 112  Temp(Src) 98.5 F (36.9 C) (Oral)  Resp 16  Ht 5' 5.5" (1.664 m)  Wt 179 lb (81.194 kg)  BMI 29.32 kg/m2  SpO2 100%    Objective:   Physical Exam  Constitutional: She is oriented to person, place, and time. She appears well-developed and well-nourished.  HENT:  Head: Normocephalic and atraumatic.  Cardiovascular: Normal rate, regular rhythm and normal heart sounds.   No murmur heard. Pulmonary/Chest: Effort normal and breath sounds normal. No respiratory distress. She has no wheezes.  Neurological: She is alert and oriented to person, place, and time.  Psychiatric: She has a normal mood and affect. Her behavior is normal. Judgment and thought content normal.          Assessment & Plan:  Flu shot today

## 2014-10-20 NOTE — Patient Instructions (Signed)
Stop lexapro, start zoloft. Complete lab work prior to leaving. Follow up in 1 month for complete physical.

## 2014-10-20 NOTE — Assessment & Plan Note (Signed)
Continue PPI.  Monitor.

## 2014-10-20 NOTE — Assessment & Plan Note (Signed)
BP stable on amlodipine, continue same.  

## 2014-10-20 NOTE — Progress Notes (Signed)
Pre visit review using our clinic review tool, if applicable. No additional management support is needed unless otherwise documented below in the visit note. 

## 2014-10-21 LAB — HEPATITIS C ANTIBODY: HCV Ab: NEGATIVE

## 2014-10-21 LAB — HIV ANTIBODY (ROUTINE TESTING W REFLEX): HIV: NONREACTIVE

## 2014-11-04 ENCOUNTER — Other Ambulatory Visit: Payer: Self-pay | Admitting: Family

## 2014-11-04 NOTE — Telephone Encounter (Signed)
Pt called to give ID acct # [id number] for [facility]. This needs to be on RXs to submit to ins.  Pt needing refills on: Amlodipine (1 week left) Cyclobenzaprint, omeprazole, sertraline (2 weeks each)

## 2014-11-05 MED ORDER — SERTRALINE HCL 50 MG PO TABS: 50.0000 mg | ORAL_TABLET | Freq: Every day | ORAL | Status: DC

## 2014-11-05 MED ORDER — AMLODIPINE BESYLATE 5 MG PO TABS: 5.0000 mg | ORAL_TABLET | Freq: Every day | ORAL | Status: DC

## 2014-11-05 MED ORDER — OMEPRAZOLE 40 MG PO CPDR: DELAYED_RELEASE_CAPSULE | ORAL | Status: DC

## 2014-11-05 NOTE — Telephone Encounter (Signed)
OK 

## 2014-11-05 NOTE — Telephone Encounter (Signed)
Please advise if ok to send 90 day supply cyclobenzaprine to mail order?

## 2014-11-08 MED ORDER — CYCLOBENZAPRINE HCL 5 MG PO TABS: ORAL_TABLET | ORAL | Status: DC

## 2014-11-08 NOTE — Telephone Encounter (Signed)
Refill sent.

## 2014-11-16 ENCOUNTER — Other Ambulatory Visit: Payer: Self-pay | Admitting: Family

## 2014-12-08 ENCOUNTER — Ambulatory Visit (INDEPENDENT_AMBULATORY_CARE_PROVIDER_SITE_OTHER): Payer: BLUE CROSS/BLUE SHIELD | Admitting: Family

## 2014-12-08 ENCOUNTER — Other Ambulatory Visit (HOSPITAL_COMMUNITY)
Admission: RE | Admit: 2014-12-08 | Discharge: 2014-12-08 | Disposition: A | Discharge status: Home / Self Care | Payer: BLUE CROSS/BLUE SHIELD | Source: Ambulatory Visit | Attending: Family | Admitting: Family

## 2014-12-08 ENCOUNTER — Telehealth: Payer: Self-pay | Admitting: *Deleted

## 2014-12-08 ENCOUNTER — Encounter: Payer: Self-pay | Admitting: Family

## 2014-12-08 VITALS — BP 108/84 | HR 110 | Temp 98.7°F | Resp 16 | Ht 65.5 in | Wt 175.6 lb

## 2014-12-08 DIAGNOSIS — F32A Depression, unspecified: Secondary | ICD-10-CM

## 2014-12-08 DIAGNOSIS — Z0000 Encounter for general adult medical examination without abnormal findings: Secondary | ICD-10-CM

## 2014-12-08 DIAGNOSIS — F419 Anxiety disorder, unspecified: Secondary | ICD-10-CM

## 2014-12-08 DIAGNOSIS — Z1151 Encounter for screening for human papillomavirus (HPV): Secondary | ICD-10-CM | POA: Insufficient documentation

## 2014-12-08 DIAGNOSIS — F418 Other specified anxiety disorders: Secondary | ICD-10-CM | POA: Diagnosis not present

## 2014-12-08 DIAGNOSIS — F329 Major depressive disorder, single episode, unspecified: Secondary | ICD-10-CM

## 2014-12-08 DIAGNOSIS — Z01419 Encounter for gynecological examination (general) (routine) without abnormal findings: Secondary | ICD-10-CM | POA: Diagnosis not present

## 2014-12-08 DIAGNOSIS — R110 Nausea: Secondary | ICD-10-CM | POA: Diagnosis not present

## 2014-12-08 NOTE — Assessment & Plan Note (Signed)
Discussed diet/exercise.  She has lost weight and I commended her for this.  Refer for dexa, mammo.  Pap performed today.

## 2014-12-08 NOTE — Progress Notes (Signed)
Pre visit review using our clinic review tool, if applicable. No additional management support is needed unless otherwise documented below in the visit note. 

## 2014-12-08 NOTE — Telephone Encounter (Signed)
-----   Message from Quintin Alto sent at 12/08/2014 12:22 PM EST ----- Regarding: Wrong Orders Contact: [phone] Patient states that she saw Melissa this morning and the orders were sent to the wrong place for her Endoscopy and Colonoscopy. She wants to go back to [place].

## 2014-12-08 NOTE — Patient Instructions (Signed)
Cut zoloft in half and take 1/2 tab daily x 2 weeks,  Then every other day for 1 week, then stop.  Follow up in 6 weeks. You will be contacted about your referral to GI.

## 2014-12-08 NOTE — Telephone Encounter (Signed)
Anna Crawford-- can you fax referrals to [facility] as requested by pt below?

## 2014-12-08 NOTE — Progress Notes (Addendum)
Subjective:    Patient ID: Anna Crawford, female    DOB: [birth year], 54 y.o.   MRN: [id number]  HPI  Anna Crawford is a 54 yr old female who presents today for cpx.  Immunizations:  Up to date Diet:  Fair diet- could be better Exercise: more active- Wt Readings from Last 3 Encounters:  12/08/14 175 lb 9.6 oz (79.652 kg)  10/20/14 179 lb (81.194 kg)  02/08/14 181 lb 6.4 oz (82.283 kg)  Colonoscopy: 2011 Dexa: 2013 Pap [id number] Mammogram: 2013 Dental: due - she will arrange Eye: 4/16- up to date  AM nausea/gagging. Occasional vomitting, not improved with change from lexapro to zoloft.    Anxiety/depression- Stable on zoloft. Feels better since she is retired.    Review of Systems  Constitutional: Negative for unexpected weight change.  HENT: Negative for rhinorrhea.   Respiratory: Negative for cough.   Cardiovascular: Negative for leg swelling.  Gastrointestinal: Negative for constipation.       Reports occasional diarrhea, has AM nausea.    Genitourinary: Negative for dysuria and frequency.  Musculoskeletal: Negative for myalgias and arthralgias.       Some mild neck pain.  Attributes to driving a lot.  Reports DDD  Skin: Negative for rash.  Neurological:       Rare HA with stress  Hematological: Negative for adenopathy.  Psychiatric/Behavioral:       See hpi     Past Medical History  Diagnosis Date  . Arthritis   . Degenerative disc disease   . History of chicken pox   . Anxiety   . GERD (gastroesophageal reflux disease)     Social History   Social History  . Marital Status: Divorced    Spouse Name: N/A  . Number of Children: 0  . Years of Education: N/A   Occupational History  .  [employer] History Main Topics  . Smoking status: Current Every Day Smoker -- 1.00 packs/day for 18 years    Types: Cigarettes  . Smokeless tobacco: Never Used  . Alcohol Use: Yes     Comment: social  . Drug Use: No  . Sexual Activity: Not on file   Other  Topics Concern  . Not on file   Social History Narrative   Regular exercise:  No   Caffeine Use:  2 cups coffee daily   Lives with Mom   No children   [employer]    Past Surgical History  Procedure Laterality Date  . Cholecystectomy  1998  . Breast surgery  1997    right breast biopsy--benign  . Mandible osteotomy  1982  . Ankle fracture surgery    . Cystectomy Left 2015    cyst removed from left eye.    Family History  Problem Relation Age of Onset  . Cancer Mother     history of breast  . Arthritis Mother   . Hyperlipidemia Mother   . Hypertension Mother   . Cancer Father     lung  . Hypertension Father   . Hyperlipidemia Brother   . Hypertension Brother   . Diabetes Brother   . Thyroid disease Paternal Aunt   . Arthritis Paternal Grandmother   . Thyroid disease Paternal Grandmother   . Heart attack Neg Hx     No Known Allergies  Current Outpatient Prescriptions on File Prior to Visit  Medication Sig Dispense Refill  . ALPRAZolam (XANAX) 0.25 MG tablet Take 1 tablet (0.25 mg total) by  mouth 2 (two) times daily as needed. 60 tablet 0  . amLODipine (NORVASC) 5 MG tablet Take 1 tablet (5 mg total) by mouth daily. 90 tablet 1  . Calcium Carbonate-Vitamin D (CALTRATE 600+D) 600-400 MG-UNIT per tablet Take 1 tablet by mouth 2 (two) times daily.    . cyclobenzaprine (FLEXERIL) 5 MG tablet TAKE 1 TABLET BY MOUTH EVERY NIGHT AT BEDTIME 90 tablet 0  . omeprazole (PRILOSEC) 40 MG capsule TAKE 1 CAPSULE BY MOUTH DAILY 90 capsule 1  . sertraline (ZOLOFT) 50 MG tablet Take 1 tablet (50 mg total) by mouth daily. 90 tablet 0   No current facility-administered medications on file prior to visit.    BP 108/84 mmHg  Pulse 110  Temp(Src) 98.7 F (37.1 C) (Oral)  Resp 16  Ht 5' 5.5" (1.664 m)  Wt 175 lb 9.6 oz (79.652 kg)  BMI 28.77 kg/m2  SpO2 97%       Objective:   Physical Exam  Physical Exam  Constitutional: She is oriented to person, place, and  time. She appears well-developed and well-nourished. No distress.  HENT:  Head: Normocephalic and atraumatic.  Right Ear: Tympanic membrane and ear canal normal.  Left Ear: Tympanic membrane and ear canal normal.  Mouth/Throat: Oropharynx is clear and moist.  Eyes: Pupils are equal, round, and reactive to light. No scleral icterus.  Neck: Normal range of motion. No thyromegaly present.  Cardiovascular: Normal rate and regular rhythm.   No murmur heard. Pulmonary/Chest: Effort normal and breath sounds normal. No respiratory distress. He has no wheezes. She has no rales. She exhibits no tenderness.  Abdominal: Soft. Bowel sounds are normal. He exhibits no distension and no mass. There is no tenderness. There is no rebound and no guarding.  Musculoskeletal: She exhibits no edema.  Lymphadenopathy:    She has no cervical adenopathy.  Neurological: She is alert and oriented to person, place, and time. She has normal patellar reflexes. She exhibits normal muscle tone. Coordination normal.  Skin: Skin is warm and dry.  Psychiatric: She has a normal mood and affect. Her behavior is normal. Judgment and thought content normal.  Breasts: Examined lying Right: Without masses, retractions, discharge or axillary adenopathy.  Left: Without masses, retractions, discharge or axillary adenopathy.  Inguinal/mons: Normal without inguinal adenopathy  External genitalia: Normal  BUS/Urethra/Skene's glands: Normal  Bladder: Normal  Vagina: Normal  Cervix: Normal  Uterus: normal in size, shape and contour. Midline and mobile  Adnexa/parametria:  Rt: Without masses or tenderness.  Lt: Without masses or tenderness.  Anus and perineum: Normal           Assessment & Plan:         Assessment & Plan:  Nausea- Uncontrolled. could be due to side effect of zoloft. Will also refer her to GI for evaluation of her nausea. She needs to see them anyhow for follow up colo.   Anxiety/depression- She is  feeling better mood/anxiety wise since she has retired and her stress level is low. Will try taper off of zoloft (see AVS).    Short PR interval- new, noted on ekg, refer to cardiology for further evaluation, see phone note.

## 2014-12-09 NOTE — Telephone Encounter (Signed)
Referral has been faxed to [facility]

## 2014-12-10 ENCOUNTER — Encounter: Payer: Self-pay | Admitting: Family

## 2014-12-10 ENCOUNTER — Ambulatory Visit (HOSPITAL_BASED_OUTPATIENT_CLINIC_OR_DEPARTMENT_OTHER)
Admission: RE | Admit: 2014-12-10 | Discharge: 2014-12-10 | Disposition: A | Discharge status: Home / Self Care | Payer: BLUE CROSS/BLUE SHIELD | Source: Ambulatory Visit | Attending: Family | Admitting: Family

## 2014-12-10 DIAGNOSIS — Z0000 Encounter for general adult medical examination without abnormal findings: Secondary | ICD-10-CM | POA: Diagnosis not present

## 2014-12-10 DIAGNOSIS — Z1231 Encounter for screening mammogram for malignant neoplasm of breast: Secondary | ICD-10-CM | POA: Insufficient documentation

## 2014-12-10 DIAGNOSIS — F17200 Nicotine dependence, unspecified, uncomplicated: Secondary | ICD-10-CM | POA: Diagnosis not present

## 2014-12-10 DIAGNOSIS — Z780 Asymptomatic menopausal state: Secondary | ICD-10-CM | POA: Insufficient documentation

## 2014-12-12 ENCOUNTER — Telehealth: Payer: Self-pay | Admitting: Family

## 2014-12-12 DIAGNOSIS — R9431 Abnormal electrocardiogram [ECG] [EKG]: Secondary | ICD-10-CM

## 2014-12-12 NOTE — Addendum Note (Signed)
Addended by: Debbrah Alar on: 12/12/2014 09:21 AM   Modules accepted: Miquel Dunn

## 2014-12-12 NOTE — Telephone Encounter (Signed)
Please let pt know that I further reviewed her EKG and her previous EKG.   Note is make of short PR interval. This has to do with the electrical conduction.  Previous PR interval was normal.  I would like her to meet with cardiology for further evaluation.

## 2014-12-13 LAB — CYTOLOGY - PAP

## 2014-12-13 NOTE — Telephone Encounter (Signed)
Notified pt and she voices understanding and is agreeable to proceed with referral. Pt requests to see Dr Stanford Breed, I have update referral.

## 2014-12-14 ENCOUNTER — Encounter: Payer: Self-pay | Admitting: Family

## 2014-12-14 ENCOUNTER — Telehealth: Payer: Self-pay | Admitting: Family

## 2014-12-14 DIAGNOSIS — B977 Papillomavirus as the cause of diseases classified elsewhere: Secondary | ICD-10-CM | POA: Insufficient documentation

## 2014-12-14 HISTORY — DX: Papillomavirus as the cause of diseases classified elsewhere: B97.7

## 2014-12-14 NOTE — Telephone Encounter (Signed)
Please contact patient and let her know that I received another note from pathology and it looks like there was finding of high risk HPV on pap. This can increase risk of cervical cancer.  I would like her to meet with gyn (pended).

## 2014-12-15 NOTE — Telephone Encounter (Signed)
Notified pt and she voices understanding and is agreeable to proceed with referral. Pt has seen Dr Matthew Saras in the past and wished to go back to him. Referral updated and signed. Pt states she arrange GI appt with [place] as they are able to see her tomorrow.  Pt also wants to let PCP know that she is not going to stop zoloft at this time due to all of the specialists that she is having to see and will try to wean off after things settle down. Pt has cancelled f/u with PCP in January.

## 2014-12-16 ENCOUNTER — Ambulatory Visit (INDEPENDENT_AMBULATORY_CARE_PROVIDER_SITE_OTHER): Payer: BLUE CROSS/BLUE SHIELD | Admitting: Gastroenterology

## 2014-12-16 ENCOUNTER — Encounter: Payer: Self-pay | Admitting: Gastroenterology

## 2014-12-16 VITALS — BP 112/82 | HR 96 | Ht 64.17 in | Wt 175.2 lb

## 2014-12-16 DIAGNOSIS — R110 Nausea: Secondary | ICD-10-CM | POA: Diagnosis not present

## 2014-12-16 DIAGNOSIS — R1013 Epigastric pain: Secondary | ICD-10-CM

## 2014-12-16 DIAGNOSIS — R12 Heartburn: Secondary | ICD-10-CM

## 2014-12-16 DIAGNOSIS — Z1211 Encounter for screening for malignant neoplasm of colon: Secondary | ICD-10-CM

## 2014-12-16 DIAGNOSIS — R197 Diarrhea, unspecified: Secondary | ICD-10-CM | POA: Diagnosis not present

## 2014-12-16 MED ORDER — NA SULFATE-K SULFATE-MG SULF 17.5-3.13-1.6 GM/177ML PO SOLN: 1.0000 | Freq: Once | ORAL | Status: DC

## 2014-12-16 MED ORDER — OMEPRAZOLE 40 MG PO CPDR: 40.0000 mg | DELAYED_RELEASE_CAPSULE | Freq: Two times a day (BID) | ORAL | Status: DC

## 2014-12-16 NOTE — Patient Instructions (Signed)
You have been scheduled for an endoscopy and colonoscopy. Please follow the written instructions given to you at your visit today. Please pick up your prep supplies at the pharmacy within the next 1-3 days. If you use inhalers (even only as needed), please bring them with you on the day of your procedure. Your physician has requested that you go to www.startemmi.com and enter the access code given to you at your visit today. This web site gives a general overview about your procedure. However, you should still follow specific instructions given to you by our office regarding your preparation for the procedure.  We have sent in Omeprazole to your pharmacy take twice a day 30 minutes before meals

## 2014-12-17 ENCOUNTER — Telehealth: Payer: Self-pay | Admitting: Gastroenterology

## 2014-12-17 NOTE — Progress Notes (Signed)
Anna Crawford    [id number]    05-03-1960  Primary Care Physician:O'SULLIVAN,MELISSA S., NP  Referring Physician: Debbrah Alar, NP [address] [place], [place] [address]  Chief complaint:  Nausea   HPI: 54 year old female here to establish care with complaints of nausea and epigastric pain associated with intermittent heartburn. She had colonoscopy done by [facility] 5 years ago was unremarkable except for small internal hemorrhoids. She was recommended to undergo repeat colonoscopy in 5 years and has a appointment to see them in January but she felt she could not wait until then and scheduled a visit with [place]. She complains of intermittent heartburn on omeprazole once a day and has nausea almost every day in the morning when she wakes up. She also reported having diarrhea on-and-off for the past few months. Denies any change in diet. No blood in stool, she had 1 episode of bright red blood thinks likely from hemorrhoids in the past 1 year.    Outpatient Encounter Prescriptions as of 12/16/2014  Medication Sig  . ALPRAZolam (XANAX) 0.25 MG tablet Take 1 tablet (0.25 mg total) by mouth 2 (two) times daily as needed.  Marland Kitchen amLODipine (NORVASC) 5 MG tablet Take 1 tablet (5 mg total) by mouth daily.  . cyclobenzaprine (FLEXERIL) 5 MG tablet TAKE 1 TABLET BY MOUTH EVERY NIGHT AT BEDTIME  . omeprazole (PRILOSEC) 40 MG capsule TAKE 1 CAPSULE BY MOUTH DAILY  . sertraline (ZOLOFT) 50 MG tablet Take 1 tablet (50 mg total) by mouth daily.  . Na Sulfate-K Sulfate-Mg Sulf (SUPREP BOWEL PREP) SOLN Take 1 kit by mouth once.  Marland Kitchen omeprazole (PRILOSEC) 40 MG capsule Take 1 capsule (40 mg total) by mouth 2 (two) times daily.  . [DISCONTINUED] Calcium Carbonate-Vitamin D (CALTRATE 600+D) 600-400 MG-UNIT per tablet Take 1 tablet by mouth 2 (two) times daily.   No facility-administered encounter medications on file as of 12/16/2014.    Allergies as of 12/16/2014  . (No Known  Allergies)    Past Medical History  Diagnosis Date  . Arthritis   . Degenerative disc disease   . History of chicken pox   . Anxiety   . GERD (gastroesophageal reflux disease)   . 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 HTN (hypertension)     Past Surgical History  Procedure Laterality Date  . Cholecystectomy  1998  . Breast biopsy Right 1997    benign  . Mandible osteotomy  1982  . Ankle fracture surgery Left   . Cystectomy Left 2015    cyst removed from left eye.  . Foot fracture surgery Right     Family History  Problem Relation Age of Onset  . Breast cancer Mother     history of breast  . Arthritis Mother   . Hyperlipidemia Mother   . Hypertension Mother   . Lung cancer Father   . Hypertension Father   . Hyperlipidemia Brother   . Hypertension Brother   . Diabetes Brother   . Thyroid disease Paternal Aunt   . Arthritis Paternal Grandmother   . Thyroid disease Paternal Grandmother   . Heart attack Neg Hx   . Colon polyps Father   . Colon polyps Mother   . Heart murmur Father   . Heart disease Brother     A fib    Social History   Social History  . Marital Status: Divorced    Spouse Name: N/A  .  Number of Children: 0  . Years of Education: N/A   Occupational History  . retired [employer] History Main Topics  . Smoking status: Current Every Day Smoker -- 1.00 packs/day for 18 years    Types: Cigarettes  . Smokeless tobacco: Never Used  . Alcohol Use: Yes     Comment: social  . Drug Use: No  . Sexual Activity: Not on file   Other Topics Concern  . Not on file   Social History Narrative   Regular exercise:  No   Caffeine Use:  2 cups coffee daily   Lives with Mom   No children   [employer]      Review of systems: Review of Systems  Constitutional: Negative for fever and chills.  HENT: Negative.   Eyes: Negative for blurred vision.  Respiratory: Negative for cough, shortness of breath and wheezing.   Cardiovascular:  Negative for chest pain and palpitations.  Gastrointestinal: as per HPI Genitourinary: Negative for dysuria, urgency, frequency and hematuria.  Musculoskeletal: Negative for myalgias, back pain and joint pain.  Skin: Negative for itching and rash.  Neurological: Negative for dizziness, tremors, focal weakness, seizures and loss of consciousness.  Endo/Heme/Allergies: Negative for environmental allergies.  Psychiatric/Behavioral: Negative for depression, suicidal ideas and hallucinations.  All other systems reviewed and are negative.   Physical Exam: Filed Vitals:   12/16/14 0854  BP: 112/82  Pulse: 96   Gen:      No acute distress HEENT:  EOMI, sclera anicteric Neck:     No masses; no thyromegaly Lungs:    Clear to auscultation bilaterally; normal respiratory effort CV:         Regular rate and rhythm; no murmurs Abd:      + bowel sounds; soft, non-tender; no palpable masses, no distension Ext:    No edema; adequate peripheral perfusion Skin:      Warm and dry; no rash Neuro: alert and oriented x 3 Psych: normal mood and affect    Assessment and Plan/Recommendations:  53 year old female here with complaints of complaints of daily nausea and intermittent heartburn associated with epigastric pain; patient also complains of diarrhea intermittently We'll schedule for EGD to evaluate for possible peptic ulcer disease, rule out H. Pylori PPI twice daily Discussed antireflux measures She was recommended to undergo colonoscopy in 5 years by her previous GI, we'll schedule it and also evaluate for possible microscopic colitis Return after the procedures  K. Denzil Magnuson , MD [phone] Mon-Fri 8a-5p [phone] after 5p, weekends, holidays

## 2014-12-17 NOTE — Telephone Encounter (Signed)
Noted  

## 2014-12-28 ENCOUNTER — Other Ambulatory Visit: Payer: Self-pay | Admitting: Family

## 2014-12-29 NOTE — Telephone Encounter (Signed)
Rx faxed to pharmacy  

## 2015-01-19 ENCOUNTER — Ambulatory Visit: Payer: BLUE CROSS/BLUE SHIELD | Admitting: Family

## 2015-01-21 ENCOUNTER — Telehealth: Payer: Self-pay | Admitting: *Deleted

## 2015-01-21 ENCOUNTER — Ambulatory Visit: Payer: BLUE CROSS/BLUE SHIELD | Admitting: Cardiology

## 2015-01-21 MED ORDER — SERTRALINE HCL 50 MG PO TABS: 50.0000 mg | ORAL_TABLET | Freq: Every day | ORAL | Status: DC

## 2015-01-21 NOTE — Telephone Encounter (Signed)
Received fax from [facility] for refill of sertraline 50mg  once a day.  Refill sent to pharmacy.

## 2015-01-25 ENCOUNTER — Other Ambulatory Visit: Payer: Self-pay | Admitting: Family

## 2015-01-25 ENCOUNTER — Telehealth: Payer: Self-pay | Admitting: Family

## 2015-01-25 MED ORDER — SERTRALINE HCL 50 MG PO TABS: 50.0000 mg | ORAL_TABLET | Freq: Every day | ORAL | Status: DC

## 2015-01-25 MED ORDER — OMEPRAZOLE 40 MG PO CPDR: DELAYED_RELEASE_CAPSULE | ORAL | Status: DC

## 2015-01-25 MED ORDER — AMLODIPINE BESYLATE 5 MG PO TABS: 5.0000 mg | ORAL_TABLET | Freq: Every day | ORAL | Status: DC

## 2015-01-25 MED ORDER — CYCLOBENZAPRINE HCL 5 MG PO TABS: ORAL_TABLET | ORAL | Status: DC

## 2015-01-25 NOTE — Telephone Encounter (Signed)
Last alprazolam Rx:  12/28/14, #60 Next UDS due: 05/2015 No future appts scheduled for pt.  Rx printed and forwarded to PCP for signature.

## 2015-01-25 NOTE — Telephone Encounter (Signed)
Pt last seen 11/2014 and was instructed how to wean off sertraline but called back in December to say that she wanted to continue sertraline. Pt also cancelled 6 week f/u at that time. I just sent a 90 day supply of her sertraline with no refills. When do you want to see pt for f/u and please advise request for 1 year of refills?

## 2015-01-25 NOTE — Telephone Encounter (Signed)
Caller name: Self  Can be reached: [phone]  Pharmacy: [facility]  Phone # [phone]  Reason for call: Patient request call back about the way her rx's are written. States that her rx needs to be written for 1 year. Please call patient if you need more details

## 2015-01-25 NOTE — Telephone Encounter (Signed)
Spoke with pt and advised her of below and she voices understanding. States that [facility] told her if she could get refills up to 1 year they could put her on auto refills. Advised pt we only do 6 month supply at a time of her maintenance meds and do not do refills x 1 year. Advised pt I would re-send requested Rxs and she voices understanding.

## 2015-01-25 NOTE — Telephone Encounter (Signed)
5/17 follow up please. We will need to follow up on BP at that time.  OK to send 6 month supply of sertraline.  Will reassess at her 6 month follow up.

## 2015-01-25 NOTE — Telephone Encounter (Signed)
Rx faxed to pharmacy  

## 2015-01-27 ENCOUNTER — Encounter: Payer: BLUE CROSS/BLUE SHIELD | Admitting: Gastroenterology

## 2015-02-09 ENCOUNTER — Ambulatory Visit: Payer: BLUE CROSS/BLUE SHIELD | Admitting: Cardiology

## 2015-03-01 ENCOUNTER — Telehealth: Payer: Self-pay | Admitting: Family

## 2015-03-01 NOTE — Telephone Encounter (Signed)
Spoke with pt. She states she has questions for PCP that she does not want to relay to me. She would like PCP to call her directly by the end of the week if possible?

## 2015-03-01 NOTE — Telephone Encounter (Signed)
Caller name: [place]   Relationship to patient: Self   Can be reached: [phone]  Reason for call: Pt is requesting a call back. She said that she would rather not say why. She says that she would like a call back from PCP.

## 2015-03-02 NOTE — Telephone Encounter (Signed)
Pharmacy: [id number] - [place], [place] - [address] AT [address]  Reason for call: Pt is requesting that we refill her xanax and put refills on it til May. She said she is not coming in for anything else. She shouldn't need to come more than every 6 months due to controlled substances. Pt also states she has a question directly to [facility] that needs answered and still requesting a call back from [place].

## 2015-03-02 NOTE — Telephone Encounter (Signed)
Anna Crawford-- is it ok to call in Xanax refill? Last Rx 01/25/15, next UDS due 05/2015.

## 2015-03-02 NOTE — Telephone Encounter (Signed)
Anna Crawford- can you please re-initiate pt's referral to Margarette Asal MD- pt says she is already established with him in the past and office said "no record".  She would like to see him anyway as a new patient. Could you please contact their office to re-initiate referral? thanks

## 2015-03-03 MED ORDER — ALPRAZOLAM 0.25 MG PO TABS: 0.2500 mg | ORAL_TABLET | Freq: Two times a day (BID) | ORAL | Status: DC | PRN

## 2015-03-03 NOTE — Telephone Encounter (Signed)
Pt is scheduled to see Dr Matthew Saras on 03/15/14 @ 2:40, pt aware

## 2015-03-03 NOTE — Telephone Encounter (Signed)
Rx phone in to the pharmacy and the patient has been made aware.    KP

## 2015-03-03 NOTE — Telephone Encounter (Signed)
Yes, pls call in 60 tabs of xanax.

## 2015-03-09 LAB — HM PAP SMEAR: HM PAP: NORMAL

## 2015-04-26 ENCOUNTER — Telehealth: Payer: Self-pay | Admitting: *Deleted

## 2015-04-26 MED ORDER — ALPRAZOLAM 0.25 MG PO TABS: 0.2500 mg | ORAL_TABLET | Freq: Two times a day (BID) | ORAL | Status: DC | PRN

## 2015-04-26 NOTE — Telephone Encounter (Signed)
Received request from [facility] for Alprazolam 0.25mg .   Last Rx:  03/03/15, #60 UDS:  Next due 05/2015, low risk Last OV:  11/2014. Pt did not want to follow up in office more frequently than 6 months. May would be 6 months. Do you want to see pt for f/u then?  Rx printed and forwarded to PCP for signature.

## 2015-04-26 NOTE — Telephone Encounter (Signed)
Anna Crawford-- please call pt and scheduled her for her 6 month follow up in May with Melissa.  Thanks!

## 2015-04-26 NOTE — Telephone Encounter (Signed)
Rx faxed to pharmacy.  Please advise f/u? 

## 2015-04-26 NOTE — Telephone Encounter (Signed)
OK to send refill, follow up in May please.

## 2015-04-29 NOTE — Telephone Encounter (Signed)
lvm for pt to call our office.

## 2015-05-23 ENCOUNTER — Telehealth: Payer: Self-pay | Admitting: Family

## 2015-05-23 NOTE — Telephone Encounter (Signed)
Rx faxed to pharmacy.  Please call pt to schedule f/u with Melissa now. Thanks! 

## 2015-05-23 NOTE — Telephone Encounter (Signed)
Last alprazolam rx:  04/26/15, #60 Last OV:  11/2014 Next OV: due now. UDS:  Due 06/07/15.  Rx printed and forwarded to PCP for signature.  Pt needs f/u and UDS this month.

## 2015-05-24 NOTE — Telephone Encounter (Signed)
Called pt. Pt has been scheduled per phone note.

## 2015-06-28 ENCOUNTER — Other Ambulatory Visit: Payer: Self-pay | Admitting: Family

## 2015-06-28 NOTE — Telephone Encounter (Signed)
Last Alprazolam Rx:  05/23/15  #60 UDS: moderate risk, due now. Will collect at f/u on 06/29/15.  Rx printed and forwarded to PCP for signature.

## 2015-06-29 ENCOUNTER — Ambulatory Visit (INDEPENDENT_AMBULATORY_CARE_PROVIDER_SITE_OTHER): Payer: BLUE CROSS/BLUE SHIELD | Admitting: Family

## 2015-06-29 ENCOUNTER — Telehealth: Payer: Self-pay | Admitting: Family

## 2015-06-29 ENCOUNTER — Encounter: Payer: Self-pay | Admitting: Family

## 2015-06-29 VITALS — BP 124/86 | HR 117 | Temp 98.6°F | Resp 18 | Ht 64.0 in | Wt 169.0 lb

## 2015-06-29 DIAGNOSIS — F418 Other specified anxiety disorders: Secondary | ICD-10-CM | POA: Diagnosis not present

## 2015-06-29 DIAGNOSIS — F32A Depression, unspecified: Secondary | ICD-10-CM

## 2015-06-29 DIAGNOSIS — I10 Essential (primary) hypertension: Secondary | ICD-10-CM | POA: Diagnosis not present

## 2015-06-29 DIAGNOSIS — E538 Deficiency of other specified B group vitamins: Secondary | ICD-10-CM | POA: Diagnosis not present

## 2015-06-29 DIAGNOSIS — F419 Anxiety disorder, unspecified: Secondary | ICD-10-CM

## 2015-06-29 DIAGNOSIS — F329 Major depressive disorder, single episode, unspecified: Secondary | ICD-10-CM

## 2015-06-29 DIAGNOSIS — K219 Gastro-esophageal reflux disease without esophagitis: Secondary | ICD-10-CM

## 2015-06-29 LAB — BASIC METABOLIC PANEL
BUN: 17 mg/dL (ref 6–23)
CO2: 25 mEq/L (ref 19–32)
CREATININE: 0.67 mg/dL (ref 0.40–1.20)
Calcium: 10.7 mg/dL — ABNORMAL HIGH (ref 8.4–10.5)
Chloride: 103 mEq/L (ref 96–112)
GFR: 96.71 mL/min (ref >60.00)
Glucose, Bld: 97 mg/dL (ref 70–99)
Potassium: 3.9 mEq/L (ref 3.5–5.1)
Sodium: 138 mEq/L (ref 135–145)

## 2015-06-29 LAB — VITAMIN B12: VITAMIN B 12: 219 pg/mL (ref 211–911)

## 2015-06-29 MED ORDER — SERTRALINE HCL 100 MG PO TABS: 100.0000 mg | ORAL_TABLET | Freq: Every day | ORAL | Status: DC

## 2015-06-29 NOTE — Progress Notes (Signed)
Subjective:    Patient ID: Anna Crawford, female    DOB: Mar 09, 1960, 55 y.o.   MRN: [id number]  HPI  Anna Crawford is a 55 yr old female who presents today for follow up.  1) Anxiety/depression- currently maintained on xanax and zoloft. Reports she has days when she feels unengaged and wants to be alone. Other days she is fine. Has to really work hard to get out of the house. Used to like to get out more. Does not endorse any recent anxiety symptoms.   2) HTN- maintained on amlodipine 5mg  once daily. BP Readings from Last 3 Encounters:  06/29/15 124/86  12/16/14 112/82  12/08/14 108/84   3) GERD- maintained on prilosec. Saw GI back in December due to nausea/epigastric pain and intermittent heartburn. PPI was changed to BID at that time and an endoscopy was recommended. This has not been completed due to cost. She tells me that she did not bump up to a bid schedule. Notes that nausea is better since she switched over to zoloft. She reports that her epigastric discomfort is improved.   Has been more active outside and has lost some weight.   Review of Systems See HPI  Past Medical History  Diagnosis Date  . Arthritis   . Degenerative disc disease   . History of chicken pox   . Anxiety   . GERD (gastroesophageal reflux disease)   . 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 HTN (hypertension)      Social History   Social History  . Marital Status: Divorced    Spouse Name: N/A  . Number of Children: 0  . Years of Education: N/A   Occupational History  . retired [employer] History Main Topics  . Smoking status: Current Every Day Smoker -- 1.00 packs/day for 18 years    Types: Cigarettes  . Smokeless tobacco: Never Used  . Alcohol Use: Yes     Comment: social  . Drug Use: No  . Sexual Activity: Not on file   Other Topics Concern  . Not on file   Social History Narrative   Regular exercise:  No   Caffeine Use:  2 cups coffee daily   Lives with Mom   No children   [employer]    Past Surgical History  Procedure Laterality Date  . Cholecystectomy  1998  . Breast biopsy Right 1997    benign  . Mandible osteotomy  1982  . Ankle fracture surgery Left   . Cystectomy Left 2015    cyst removed from left eye.  . Foot fracture surgery Right     Family History  Problem Relation Age of Onset  . Breast cancer Mother     history of breast  . Arthritis Mother   . Hyperlipidemia Mother   . Hypertension Mother   . Lung cancer Father   . Hypertension Father   . Hyperlipidemia Brother   . Hypertension Brother   . Diabetes Brother   . Thyroid disease Paternal Aunt   . Arthritis Paternal Grandmother   . Thyroid disease Paternal Grandmother   . Heart attack Neg Hx   . Colon polyps Father   . Colon polyps Mother   . Heart murmur Father   . Heart disease Brother     A fib    No Known Allergies  Current Outpatient Prescriptions on File Prior to Visit  Medication Sig Dispense Refill  . ALPRAZolam (XANAX) 0.25 MG tablet TAKE 1  TABLET BY MOUTH TWICE DAILY AS NEEDED 60 tablet 0  . amLODipine (NORVASC) 5 MG tablet Take 1 tablet (5 mg total) by mouth daily. 90 tablet 1  . cyclobenzaprine (FLEXERIL) 5 MG tablet TAKE 1 TABLET BY MOUTH EVERY NIGHT AT BEDTIME 90 tablet 1  . omeprazole (PRILOSEC) 40 MG capsule TAKE 1 CAPSULE BY MOUTH DAILY 90 capsule 1  . sertraline (ZOLOFT) 50 MG tablet Take 1 tablet (50 mg total) by mouth daily. 90 tablet 1   No current facility-administered medications on file prior to visit.    BP 124/86 mmHg  Pulse 117  Temp(Src) 98.6 F (37 C) (Oral)  Resp 18  Ht 5\' 4"  (1.626 m)  Wt 169 lb (76.658 kg)  BMI 28.99 kg/m2  SpO2 100%       Objective:   Physical Exam  Constitutional: She is oriented to person, place, and time. She appears well-developed and well-nourished.  Cardiovascular: Normal rate, regular rhythm and normal heart sounds.   No murmur heard. Pulmonary/Chest: Effort normal and breath sounds  normal. No respiratory distress. She has no wheezes.  Neurological: She is alert and oriented to person, place, and time.  Psychiatric: She has a normal mood and affect. Her behavior is normal. Judgment and thought content normal.          Assessment & Plan:

## 2015-06-29 NOTE — Telephone Encounter (Signed)
Signed.

## 2015-06-29 NOTE — Progress Notes (Signed)
Pre visit review using our clinic review tool, if applicable. No additional management support is needed unless otherwise documented below in the visit note. 

## 2015-06-29 NOTE — Patient Instructions (Signed)
Please increase zoloft to 100mg  once daily.  Complete lab work prior to leaving. Follow up in 6 weeks.

## 2015-06-29 NOTE — Telephone Encounter (Signed)
Spoke with pt. She states she still has plenty of 50mg  sertraline on hand and will take 2 of those until her next appt. If refill is needed before her f/u she will let us know and get 30 day supply at local pharmacy.

## 2015-06-29 NOTE — Assessment & Plan Note (Signed)
Obtain b12 level.  

## 2015-06-29 NOTE — Assessment & Plan Note (Signed)
BP stable on current medications, continue same, obtain bmet.

## 2015-06-29 NOTE — Assessment & Plan Note (Signed)
Uncontrolled. Will increase zoloft from 50mg  to 100mg .

## 2015-06-29 NOTE — Telephone Encounter (Signed)
Sertraline dose was increased today and she is to follow up in 6 weeks for re-assessment. Will not send to mail order until dose is stable. Xanax was faxed to the pharmacy this morning. Attempted to reach pt and left message for pt to return my call.

## 2015-06-29 NOTE — Assessment & Plan Note (Signed)
Improved, continue prilosec once daily.

## 2015-06-29 NOTE — Telephone Encounter (Signed)
Rx faxed to pharmacy  

## 2015-06-29 NOTE — Telephone Encounter (Signed)
Relation to [id number] Call back [phone]  Pharmacy: [facility]    Reason for call:  Patient requesting sertraline (ZOLOFT) 100 MG tablet please send to [place] and patient wanted to ensure ALPRAZolam (XANAX) 0.25 MG tablet  was sent to  [id number] - [place], [place] - [address] AT [place] [phone] (Phone) [phone] (Fax)

## 2015-06-30 ENCOUNTER — Telehealth: Payer: Self-pay | Admitting: Family

## 2015-06-30 MED ORDER — CYANOCOBALAMIN 500 MCG PO TABS: 500.0000 ug | ORAL_TABLET | Freq: Every day | ORAL | Status: AC

## 2015-06-30 NOTE — Telephone Encounter (Signed)
b12 level is low. Is she taking oral supplement? If not, I would like her to start b12 supplement 521mcg/day. Repeat b12 in 3 months, dx b12 deficiency. Calcium is high. I would like her to complete intact PTH with calcium please. Dx hypercalcemia.

## 2015-07-05 ENCOUNTER — Other Ambulatory Visit: Payer: Self-pay | Admitting: Family

## 2015-07-05 NOTE — Telephone Encounter (Signed)
Notified pt and she voices understanding. Lab appt scheduled for tomorrow at 9:15am and order entered. She is unable to schedule B12 lab appt for September due to upcoming travel dates and will call [place] back to schedule once these dates are confirmed.

## 2015-07-06 ENCOUNTER — Other Ambulatory Visit (INDEPENDENT_AMBULATORY_CARE_PROVIDER_SITE_OTHER): Payer: BLUE CROSS/BLUE SHIELD

## 2015-07-06 NOTE — Telephone Encounter (Signed)
Noted  

## 2015-07-06 NOTE — Telephone Encounter (Addendum)
Pt called stating she was not able to find B12 581mcg tablets. She purchased 103mcg tablets and states she will take 1 of those daily. Pt states she is travelling for the next 2 weeks and will be able to be reached at home # when she returns. States if we have urgent information for her we may call her at [phone]. She will call to schedule f/u labs when she returns from vacation.  Please advise.

## 2015-07-07 ENCOUNTER — Encounter: Payer: Self-pay | Admitting: Family

## 2015-07-07 LAB — PTH, INTACT AND CALCIUM
Calcium: 9.4 mg/dL (ref 8.4–10.5)
PTH: 55 pg/mL (ref 14–64)

## 2015-07-19 ENCOUNTER — Encounter: Payer: Self-pay | Admitting: Family

## 2015-07-26 ENCOUNTER — Telehealth: Payer: Self-pay | Admitting: *Deleted

## 2015-07-26 NOTE — Telephone Encounter (Signed)
Pt called requesting to reschedule her appt on 08/10/15 further out. States she is supposed to return in September for repeat b12 lab and wants to combine both. Also has other upcoming medical bills that she wants to get through and requests appt for October. States she is currently doing will on new dose of Sertraline 100mg  daily and will let us know if anything changes. Follow up scheduled for 11/01/15 at 10:45am.

## 2015-08-03 ENCOUNTER — Other Ambulatory Visit: Payer: Self-pay | Admitting: Family

## 2015-08-03 NOTE — Telephone Encounter (Signed)
Rx was faxed to pharmacy at 3:30pm.

## 2015-08-03 NOTE — Telephone Encounter (Signed)
Last alprazolam 0.25mg  #60: 06/28/15 UDS: 06/29/15, moderate and due 09/2015  Rx printed and forwarded to PCP for signature.

## 2015-08-05 ENCOUNTER — Telehealth: Payer: Self-pay

## 2015-08-05 NOTE — Telephone Encounter (Signed)
Called patient to inform her that I was having coding review the bills. No answer and dpr request no messages be left

## 2015-08-10 ENCOUNTER — Ambulatory Visit: Payer: BLUE CROSS/BLUE SHIELD | Admitting: Family

## 2015-08-24 ENCOUNTER — Other Ambulatory Visit: Payer: Self-pay | Admitting: Family

## 2015-09-16 ENCOUNTER — Telehealth: Payer: Self-pay | Admitting: *Deleted

## 2015-09-16 MED ORDER — ALPRAZOLAM 0.25 MG PO TABS: 0.2500 mg | ORAL_TABLET | Freq: Two times a day (BID) | ORAL | 0 refills | Status: DC | PRN

## 2015-09-16 NOTE — Telephone Encounter (Signed)
Received request from [facility] fro alprazolam 0.25mg  refill. Last RX:  08/03/15, #60 Last OV: 06/29/15 Next OV: 11/01/15  Rx printed and forwarded to PCP for signature.

## 2015-09-16 NOTE — Telephone Encounter (Signed)
Rx faxed to pharmacy  

## 2015-10-08 ENCOUNTER — Telehealth: Payer: Self-pay | Admitting: Family

## 2015-10-10 NOTE — Telephone Encounter (Signed)
Please schedule pt for follow up appt Thanks PC

## 2015-10-11 NOTE — Telephone Encounter (Signed)
Pt already has F/U appt scheduled for 10/24. appt was scheduled in July for pt.     Thanks.

## 2015-10-31 ENCOUNTER — Telehealth: Payer: Self-pay | Admitting: *Deleted

## 2015-10-31 MED ORDER — ALPRAZOLAM 0.25 MG PO TABS: 0.2500 mg | ORAL_TABLET | Freq: Two times a day (BID) | ORAL | 0 refills | Status: DC | PRN

## 2015-10-31 NOTE — Telephone Encounter (Signed)
Received fax from [facility] for alprazolam refill.  Last RX: 09/16/15, #60 UDS: due at follow up tomorrow Next OV:  11/01/15  Rx printed and forwarded to PCP for signature.

## 2015-10-31 NOTE — Telephone Encounter (Signed)
Rx faxed to pharmacy  

## 2015-11-01 ENCOUNTER — Ambulatory Visit: Payer: BLUE CROSS/BLUE SHIELD | Admitting: Family

## 2015-11-02 ENCOUNTER — Encounter: Payer: Self-pay | Admitting: Family

## 2015-11-02 ENCOUNTER — Ambulatory Visit (INDEPENDENT_AMBULATORY_CARE_PROVIDER_SITE_OTHER): Payer: BLUE CROSS/BLUE SHIELD | Admitting: Family

## 2015-11-02 DIAGNOSIS — E538 Deficiency of other specified B group vitamins: Secondary | ICD-10-CM | POA: Diagnosis not present

## 2015-11-02 DIAGNOSIS — I10 Essential (primary) hypertension: Secondary | ICD-10-CM | POA: Diagnosis not present

## 2015-11-02 DIAGNOSIS — F329 Major depressive disorder, single episode, unspecified: Secondary | ICD-10-CM

## 2015-11-02 DIAGNOSIS — F32A Depression, unspecified: Secondary | ICD-10-CM

## 2015-11-02 DIAGNOSIS — Z23 Encounter for immunization: Secondary | ICD-10-CM | POA: Diagnosis not present

## 2015-11-02 DIAGNOSIS — F418 Other specified anxiety disorders: Secondary | ICD-10-CM

## 2015-11-02 DIAGNOSIS — F419 Anxiety disorder, unspecified: Principal | ICD-10-CM

## 2015-11-02 MED ORDER — SERTRALINE HCL 100 MG PO TABS: 100.0000 mg | ORAL_TABLET | Freq: Every day | ORAL | 1 refills | Status: DC

## 2015-11-02 NOTE — Progress Notes (Signed)
Subjective:    Patient ID: Anna Crawford, female    DOB: [birth year], 55 y.o.   MRN: [id number]  HPI  Anna Crawford is a 55 yr old female who presents today for follow up of her depression.  Last visit she noted days when she felt unengaged and wanted to be alone. Had to really work hard to get out of the house. We increased her zoloft from 50mg  to 100mg . She is using xanax 0.5 QHS.   HTN- maintained on amlodipine.   BP Readings from Last 3 Encounters:  06/29/15 124/86  12/16/14 112/82  12/08/14 108/84   b12 deficiency- she is taking a b12 oral supplement.  Review of Systems    see HPI  Past Medical History:  Diagnosis Date  . Anxiety   . Arthritis   . Degenerative disc disease   . GERD (gastroesophageal reflux disease)   . 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 History of chicken pox   . HTN (hypertension)      Social History   Social History  . Marital status: Divorced    Spouse name: N/A  . Number of children: 0  . Years of education: N/A   Occupational History  . retired [place] History Main Topics  . Smoking status: Current Every Day Smoker    Packs/day: 1.00    Years: 18.00    Types: Cigarettes  . Smokeless tobacco: Never Used  . Alcohol use Yes     Comment: social  . Drug use: No  . Sexual activity: Not on file   Other Topics Concern  . Not on file   Social History Narrative   Regular exercise:  No   Caffeine Use:  2 cups coffee daily   Lives with Mom   No children   [employer]    Past Surgical History:  Procedure Laterality Date  . ANKLE FRACTURE SURGERY Left   . BREAST BIOPSY Right 1997   benign  . CHOLECYSTECTOMY  1998  . CYSTECTOMY Left 2015   cyst removed from left eye.  Marland Kitchen FOOT FRACTURE SURGERY Right   . MANDIBLE OSTEOTOMY  1982    Family History  Problem Relation Age of Onset  . Breast cancer Mother     history of breast  . Arthritis Mother   . Hyperlipidemia Mother   . Hypertension Mother   . Lung cancer  Father   . Hypertension Father   . Hyperlipidemia Brother   . Hypertension Brother   . Diabetes Brother   . Thyroid disease Paternal Aunt   . Arthritis Paternal Grandmother   . Thyroid disease Paternal Grandmother   . Heart attack Neg Hx   . Colon polyps Father   . Colon polyps Mother   . Heart murmur Father   . Heart disease Brother     A fib    No Known Allergies  Current Outpatient Prescriptions on File Prior to Visit  Medication Sig Dispense Refill  . ALPRAZolam (XANAX) 0.25 MG tablet Take 1 tablet (0.25 mg total) by mouth 2 (two) times daily as needed. 60 tablet 0  . amLODipine (NORVASC) 5 MG tablet TAKE 1 TABLET DAILY 90 tablet 0  . cyanocobalamin 500 MCG tablet Take 1 tablet (500 mcg total) by mouth daily.    . cyclobenzaprine (FLEXERIL) 5 MG tablet TAKE 1 TABLET EVERY NIGHT  AT BEDTIME 90 tablet 1  . omeprazole (PRILOSEC) 40 MG capsule TAKE 1 CAPSULE DAILY 90 capsule 0  No current facility-administered medications on file prior to visit.     BP 122/86 (BP Location: Left Arm, Cuff Size: Normal)   Pulse 88   Temp 98.1 F (36.7 C) (Oral)   Resp 16   Ht 5\' 4"  (1.626 m)   Wt 171 lb 12.8 oz (77.9 kg)   SpO2 98% Comment: room air  BMI 29.49 kg/m    Objective:   Physical Exam  Constitutional: She appears well-developed and well-nourished.  Cardiovascular: Normal rate, regular rhythm and normal heart sounds.   No murmur heard. Pulmonary/Chest: Effort normal and breath sounds normal. No respiratory distress. She has no wheezes.  Psychiatric: She has a normal mood and affect. Her behavior is normal. Judgment and thought content normal.          Assessment & Plan:

## 2015-11-02 NOTE — Assessment & Plan Note (Signed)
Continue supplement, plan b12 level next visit.

## 2015-11-02 NOTE — Progress Notes (Signed)
Pre visit review using our clinic review tool, if applicable. No additional management support is needed unless otherwise documented below in the visit note. 

## 2015-11-02 NOTE — Assessment & Plan Note (Signed)
BP stable on current medications.  

## 2015-11-02 NOTE — Assessment & Plan Note (Signed)
Improved on zoloft.  Continue current dose.

## 2015-12-05 ENCOUNTER — Other Ambulatory Visit: Payer: Self-pay | Admitting: Family

## 2015-12-05 NOTE — Telephone Encounter (Signed)
Patient called to follow up on refill for Alprazolam. States she needs it ASAP. Please call patient when ready.  [id number] - [place], [place] - [address] AT [place] [phone] (Phone) [phone] (Fax)

## 2015-12-06 NOTE — Telephone Encounter (Signed)
Ok to send #60 tabs no refill/

## 2015-12-06 NOTE — Telephone Encounter (Signed)
Rx called to [place] at [facility] and notified pt.

## 2015-12-06 NOTE — Telephone Encounter (Signed)
Last seen 11/02/15 Last filled 10/31/15 #60-0 rf Sig: take 1 tab po bid prn  Please advise  PC

## 2015-12-14 ENCOUNTER — Encounter: Payer: Self-pay | Admitting: Family

## 2015-12-14 ENCOUNTER — Ambulatory Visit (INDEPENDENT_AMBULATORY_CARE_PROVIDER_SITE_OTHER): Payer: BLUE CROSS/BLUE SHIELD | Admitting: Family

## 2015-12-14 ENCOUNTER — Telehealth: Payer: Self-pay | Admitting: Family

## 2015-12-14 VITALS — BP 120/82 | HR 105 | Temp 98.4°F | Resp 18 | Ht 64.5 in | Wt 168.2 lb

## 2015-12-14 DIAGNOSIS — Z0001 Encounter for general adult medical examination with abnormal findings: Secondary | ICD-10-CM | POA: Diagnosis not present

## 2015-12-14 DIAGNOSIS — Z0000 Encounter for general adult medical examination without abnormal findings: Secondary | ICD-10-CM | POA: Diagnosis not present

## 2015-12-14 DIAGNOSIS — R9431 Abnormal electrocardiogram [ECG] [EKG]: Secondary | ICD-10-CM

## 2015-12-14 LAB — BASIC METABOLIC PANEL
BUN: 11 mg/dL (ref 6–23)
CALCIUM: 9.5 mg/dL (ref 8.4–10.5)
CO2: 26 mEq/L (ref 19–32)
CREATININE: 0.66 mg/dL (ref 0.40–1.20)
Chloride: 107 mEq/L (ref 96–112)
GFR: 98.24 mL/min (ref >60.00)
GLUCOSE: 101 mg/dL — AB (ref 70–99)
POTASSIUM: 4 meq/L (ref 3.5–5.1)
Sodium: 141 mEq/L (ref 135–145)

## 2015-12-14 LAB — URINALYSIS, ROUTINE W REFLEX MICROSCOPIC
HGB URINE DIPSTICK: NEGATIVE
KETONES UR: NEGATIVE
LEUKOCYTES UA: NEGATIVE
NITRITE: NEGATIVE
RBC / HPF: NONE SEEN (ref 0–?)
SPECIFIC GRAVITY, URINE: 1.025 (ref 1.000–1.030)
Total Protein, Urine: NEGATIVE
URINE GLUCOSE: NEGATIVE
UROBILINOGEN UA: 0.2 (ref 0.0–1.0)
WBC UA: NONE SEEN (ref 0–?)
pH: 6 (ref 5.0–8.0)

## 2015-12-14 LAB — CBC WITH DIFFERENTIAL/PLATELET
BASOS ABS: 0.1 10*3/uL (ref 0.0–0.1)
Basophils Relative: 0.9 % (ref 0.0–3.0)
EOS ABS: 0.1 10*3/uL (ref 0.0–0.7)
Eosinophils Relative: 1.1 % (ref 0.0–5.0)
HCT: 41 % (ref 36.0–46.0)
HEMOGLOBIN: 13.9 g/dL (ref 12.0–15.0)
LYMPHS ABS: 1.4 10*3/uL (ref 0.7–4.0)
Lymphocytes Relative: 23.8 % (ref 12.0–46.0)
MCHC: 34 g/dL (ref 30.0–36.0)
MCV: 94.7 fl (ref 78.0–100.0)
MONO ABS: 0.4 10*3/uL (ref 0.1–1.0)
Monocytes Relative: 6.1 % (ref 3.0–12.0)
NEUTROS PCT: 68.1 % (ref 43.0–77.0)
Neutro Abs: 4 10*3/uL (ref 1.4–7.7)
Platelets: 301 10*3/uL (ref 150.0–400.0)
RBC: 4.32 Mil/uL (ref 3.87–5.11)
RDW: 12.6 % (ref 11.5–15.5)
WBC: 5.8 10*3/uL (ref 4.0–10.5)

## 2015-12-14 LAB — HEPATIC FUNCTION PANEL
ALT: 18 U/L (ref 0–35)
AST: 19 U/L (ref 0–37)
Albumin: 4.5 g/dL (ref 3.5–5.2)
Alkaline Phosphatase: 65 U/L (ref 39–117)
BILIRUBIN DIRECT: 0.1 mg/dL (ref 0.0–0.3)
BILIRUBIN TOTAL: 0.4 mg/dL (ref 0.2–1.2)
Total Protein: 7.3 g/dL (ref 6.0–8.3)

## 2015-12-14 LAB — LIPID PANEL
CHOL/HDL RATIO: 2
CHOLESTEROL: 225 mg/dL — AB (ref 0–200)
HDL: 92.6 mg/dL (ref >39.00)
LDL CALC: 122 mg/dL — AB (ref 0–99)
NonHDL: 132.46
Triglycerides: 54 mg/dL (ref 0.0–149.0)
VLDL: 10.8 mg/dL (ref 0.0–40.0)

## 2015-12-14 LAB — TSH: TSH: 1.58 u[IU]/mL (ref 0.35–4.50)

## 2015-12-14 NOTE — Patient Instructions (Addendum)
Check with your insurance RE: coverage for cologuard and zostavax vaccine. Try to get 30 minutes 5 days a week of walking. Continue to work on [employer].

## 2015-12-14 NOTE — Progress Notes (Signed)
Subjective:    Patient ID: Anna Crawford, female    DOB: 1960-03-29, 55 y.o.   MRN: [id number]  HPI  Patient presents today for complete physical.  Immunizations: up to date, she will chec Diet: healhty Exercise: more active around the house. Wt Readings from Last 3 Encounters:  12/14/15 168 lb 3.2 oz (76.3 kg)  11/02/15 171 lb 12.8 oz (77.9 kg)  06/29/15 169 lb (76.7 kg)  Colonoscopy: 2011, declines Dexa:  2/16 Pap Smear:3/17- GYN, normal per patient. Mammogram: 12/16     Review of Systems  Constitutional: Negative for unexpected weight change.  HENT: Negative for hearing loss and rhinorrhea.   Eyes: Negative for visual disturbance.  Respiratory: Negative for cough and shortness of breath.   Cardiovascular: Negative for chest pain.  Gastrointestinal: Negative for blood in stool, constipation and diarrhea.  Genitourinary: Negative for dysuria and frequency.  Musculoskeletal: Negative for arthralgias and myalgias.  Neurological: Negative for headaches.  Hematological: Negative for adenopathy.  Psychiatric/Behavioral:       Denies depression/anxiety       Past Medical History:  Diagnosis Date  . Anxiety   . Arthritis   . Degenerative disc disease   . GERD (gastroesophageal reflux disease)   . 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 History of chicken pox   . HTN (hypertension)      Social History   Social History  . Marital status: Divorced    Spouse name: N/A  . Number of children: 0  . Years of education: N/A   Occupational History  . retired [place] History Main Topics  . Smoking status: Current Every Day Smoker    Packs/day: 1.00    Years: 18.00    Types: Cigarettes  . Smokeless tobacco: Never Used  . Alcohol use Yes     Comment: social  . Drug use: No  . Sexual activity: Not on file   Other Topics Concern  . Not on file   Social History Narrative   Regular exercise:  No   Caffeine Use:  2 cups coffee daily   Lives with Mom   No children   [employer]    Past Surgical History:  Procedure Laterality Date  . ANKLE FRACTURE SURGERY Left   . BREAST BIOPSY Right 1997   benign  . CHOLECYSTECTOMY  1998  . CYSTECTOMY Left 2015   cyst removed from left eye.  Marland Kitchen FOOT FRACTURE SURGERY Right   . MANDIBLE OSTEOTOMY  1982    Family History  Problem Relation Age of Onset  . Breast cancer Mother     history of breast  . Arthritis Mother   . Hyperlipidemia Mother   . Hypertension Mother   . Colon polyps Mother   . Lung cancer Father   . Hypertension Father   . Colon polyps Father   . Heart murmur Father   . Hyperlipidemia Brother   . Hypertension Brother   . Diabetes Brother   . Thyroid disease Paternal Aunt   . Arthritis Paternal Grandmother   . Thyroid disease Paternal Grandmother   . Heart disease Brother     A fib  . Heart attack Neg Hx     No Known Allergies  Current Outpatient Prescriptions on File Prior to Visit  Medication Sig Dispense Refill  . ALPRAZolam (XANAX) 0.25 MG tablet TAKE 1 TABLET BY MOUTH TWICE DAILY AS NEEDED 60 tablet 0  . amLODipine (NORVASC) 5 MG tablet TAKE 1 TABLET DAILY  90 tablet 0  . cyanocobalamin 500 MCG tablet Take 1 tablet (500 mcg total) by mouth daily.    . cyclobenzaprine (FLEXERIL) 5 MG tablet TAKE 1 TABLET EVERY NIGHT  AT BEDTIME 90 tablet 1  . omeprazole (PRILOSEC) 40 MG capsule TAKE 1 CAPSULE DAILY 90 capsule 0  . sertraline (ZOLOFT) 100 MG tablet Take 1 tablet (100 mg total) by mouth daily. 90 tablet 1   No current facility-administered medications on file prior to visit.     BP 120/82 (BP Location: Left Arm, Cuff Size: Normal)   Pulse (!) 105   Temp 98.4 F (36.9 C) (Oral)   Resp 18   Ht 5' 4.5" (1.638 m)   Wt 168 lb 3.2 oz (76.3 kg)   SpO2 99% Comment: room air  BMI 28.43 kg/m    Objective:   Physical Exam  Physical Exam  Constitutional: She is oriented to person, place, and time. She appears well-developed and well-nourished. No  distress.  HENT:  Head: Normocephalic and atraumatic.  Right Ear: Tympanic membrane and ear canal normal.  Left Ear: Tympanic membrane and ear canal normal.  Mouth/Throat: Oropharynx is clear and moist.  Eyes: Pupils are equal, round, and reactive to light. No scleral icterus.  Neck: Normal range of motion. No thyromegaly present.  Cardiovascular: Normal rate and regular rhythm.   No murmur heard. Pulmonary/Chest: Effort normal and breath sounds normal. No respiratory distress. He has no wheezes. She has no rales. She exhibits no tenderness.  Abdominal: Soft. Bowel sounds are normal. She exhibits no distension and no mass. There is no tenderness. There is no rebound and no guarding.  Musculoskeletal: She exhibits no edema.  Lymphadenopathy:    She has no cervical adenopathy.  Neurological: She is alert and oriented to person, place, and time. She has normal patellar reflexes. She exhibits normal muscle tone. Coordination normal.  Skin: Skin is warm and dry.  Psychiatric: She has a normal mood and affect. Her behavior is normal. Judgment and thought content normal.  Breasts: Examined lying Right: Without masses, retractions, discharge or axillary adenopathy.  Left: Without masses, retractions, discharge or axillary adenopathy.  Pelvic: deferred           Assessment & Plan:         Assessment & Plan:  Preventative Care- declines colo, I have asked her to check cologuard coverage with her insurance and then let us know if she wishes to proceed. She will schedule mammogram, obtain routine lab work.   Short PR- reviewed EKG, notes Short PR syndrome. Note was made last year as well and cardiology referral was placed at that time. Pt cancelled appointment. Will re-initiate referral- see phone note 12/6.

## 2015-12-14 NOTE — Progress Notes (Signed)
Pre visit review using our clinic review tool, if applicable. No additional management support is needed unless otherwise documented below in the visit note. 

## 2015-12-14 NOTE — Telephone Encounter (Signed)
EKG performed today shows same electrical abnormality as last year.  We referred her to Dr. Stanford Breed and she cancelled. I would like to re-initiate referral.  Cholesterol is mildly elevated- please work on low fat/low cholesterol diet, exercise.  Other labs look OK.

## 2015-12-15 ENCOUNTER — Encounter: Payer: Self-pay | Admitting: Family

## 2015-12-16 NOTE — Telephone Encounter (Signed)
Notified pt and she is agreeable to proceed with referral. She will also check insurance to make sure Provider is participating with her insurance.

## 2015-12-29 ENCOUNTER — Other Ambulatory Visit: Payer: Self-pay | Admitting: Family

## 2015-12-30 NOTE — Telephone Encounter (Signed)
Last Rx:  12/06/15, #60 Last OV: 12/14/15 Next OV:  Not scheduled UDS:  Urine collected at 12/14/15 visit went for u/s not UDS.  Please advise?

## 2015-12-30 NOTE — Telephone Encounter (Signed)
Rx faxed to pharmacy and pt notified of refill date and voices understanding. Just wanted Rx now because she knew we would be on vacation.

## 2015-12-30 NOTE — Telephone Encounter (Signed)
Patient called to follow up on refill. Plse adv

## 2015-12-30 NOTE — Telephone Encounter (Signed)
See rx, cannot be filled prior to 12/26.

## 2016-01-23 NOTE — Progress Notes (Signed)
Referring: Debbrah Alar, NP  HPI: 56 year old female for evaluation of abnormal electrocardiogram. Electrocardiogram 12/14/2015 showed sinus rhythm, RV conduction delay, short PR interval. Patient has dyspnea with more extreme activities but not routine activities. No orthopnea, PND, pedal edema, chest pain, history of syncope or claudication. She had occasional palpitations in the past associated with anxiety but now that she is treated for this problem she does not have palpitations. Because of the above we were asked to evaluate.  Current Outpatient Prescriptions  Medication Sig Dispense Refill  . ALPRAZolam (XANAX) 0.25 MG tablet TAKE 1 TABLET BY MOUTH TWICE DAILY AS NEEDED FOR ANXIETY 60 tablet 0  . amLODipine (NORVASC) 5 MG tablet TAKE 1 TABLET DAILY 90 tablet 0  . cyanocobalamin 500 MCG tablet Take 1 tablet (500 mcg total) by mouth daily.    . cyclobenzaprine (FLEXERIL) 5 MG tablet TAKE 1 TABLET EVERY NIGHT  AT BEDTIME 90 tablet 1  . omeprazole (PRILOSEC) 40 MG capsule TAKE 1 CAPSULE DAILY 90 capsule 0  . sertraline (ZOLOFT) 100 MG tablet Take 100 mg by mouth daily.     . valACYclovir (VALTREX) 500 MG tablet Take 500 mg by mouth 2 (two) times daily.     No current facility-administered medications for this visit.     No Known Allergies   Past Medical History:  Diagnosis Date  . Anxiety   . Arthritis   . Degenerative disc disease   . GERD (gastroesophageal reflux disease)   . 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 History of chicken pox   . HTN (hypertension)     Past Surgical History:  Procedure Laterality Date  . ANKLE FRACTURE SURGERY Left   . BREAST BIOPSY Right 1997   benign  . CHOLECYSTECTOMY  1998  . CYSTECTOMY Left 2015   cyst removed from left eye.  Marland Kitchen FOOT FRACTURE SURGERY Right   . MANDIBLE OSTEOTOMY  1982    Social History   Social History  . Marital status: Divorced    Spouse name: N/A  . Number of children: 0  . Years of education: N/A    Occupational History  . retired [place] History Main Topics  . Smoking status: Current Every Day Smoker    Packs/day: 1.00    Years: 18.00    Types: Cigarettes  . Smokeless tobacco: Never Used  . Alcohol use Yes     Comment: social  . Drug use: No  . Sexual activity: Not on file   Other Topics Concern  . Not on file   Social History Narrative   Regular exercise:  No   Caffeine Use:  2 cups coffee daily   Lives with Mom   No children   [employer]    Family History  Problem Relation Age of Onset  . Breast cancer Mother     history of breast  . Arthritis Mother   . Hyperlipidemia Mother   . Hypertension Mother   . Colon polyps Mother   . Lung cancer Father   . Hypertension Father   . Colon polyps Father   . Heart murmur Father   . Hyperlipidemia Brother   . Hypertension Brother   . Diabetes Brother   . Thyroid disease Paternal Aunt   . Arthritis Paternal Grandmother   . Thyroid disease Paternal Grandmother   . Heart disease Brother     A fib  . Heart attack Neg Hx     ROS: no fevers or chills, productive  cough, hemoptysis, dysphasia, odynophagia, melena, hematochezia, dysuria, hematuria, rash, seizure activity, orthopnea, PND, pedal edema, claudication. Remaining systems are negative.  Physical Exam:   Blood pressure 134/88, pulse 100, height 5' 4.5" (1.638 m), weight 166 lb 6.4 oz (75.5 kg).  General:  Well developed/well nourished in NAD Skin warm/dry Patient not depressed No peripheral clubbing Back-normal HEENT-normal/normal eyelids Neck supple/normal carotid upstroke bilaterally; no bruits; no JVD; no thyromegaly chest - CTA/ normal expansion CV - RRR/normal S1 and S2; no rubs or gallops;  PMI nondisplaced; 1/6 SEM Abdomen -NT/ND, no HSM, no mass, + bowel sounds, no bruit 2+ femoral pulses, no bruits Ext-no edema, chords, 2+ DP Neuro-grossly nonfocal  ECG as above  A/P  1 abnormal electrocardiogram-patient has a short  PR interval. There is no history of syncope or problems with palpitations. She has some dyspnea on exertion.  We will plan an echocardiogram to assess LV function. He has normal motor further cardiac workup.  2 hypertension-blood pressure controlled. Continue present medications.  3 tobacco abuse-patient counseled on discontinuing.  Kirk Ruths, MD

## 2016-01-30 ENCOUNTER — Other Ambulatory Visit: Payer: Self-pay | Admitting: Family

## 2016-02-01 ENCOUNTER — Encounter (INDEPENDENT_AMBULATORY_CARE_PROVIDER_SITE_OTHER): Payer: Self-pay

## 2016-02-01 ENCOUNTER — Encounter: Payer: Self-pay | Admitting: Cardiology

## 2016-02-01 ENCOUNTER — Ambulatory Visit (INDEPENDENT_AMBULATORY_CARE_PROVIDER_SITE_OTHER): Payer: BLUE CROSS/BLUE SHIELD | Admitting: Cardiology

## 2016-02-01 VITALS — BP 134/88 | HR 100 | Ht 64.5 in | Wt 166.4 lb

## 2016-02-01 DIAGNOSIS — R9431 Abnormal electrocardiogram [ECG] [EKG]: Secondary | ICD-10-CM | POA: Diagnosis not present

## 2016-02-01 DIAGNOSIS — I10 Essential (primary) hypertension: Secondary | ICD-10-CM

## 2016-02-01 DIAGNOSIS — Z720 Tobacco use: Secondary | ICD-10-CM | POA: Diagnosis not present

## 2016-02-01 NOTE — Patient Instructions (Signed)
Medication Instructions:   NO CHANGE  Testing/Procedures:  Your physician has requested that you have an echocardiogram. Echocardiography is a painless test that uses sound waves to create images of your heart. It provides your doctor with information about the size and shape of your heart and how well your heart's chambers and valves are working. This procedure takes approximately one hour. There are no restrictions for this procedure.    Follow-Up:  Your physician recommends that you schedule a follow-up appointment in: AS NEEDED PENDING TEST RESULTS      

## 2016-02-04 ENCOUNTER — Other Ambulatory Visit: Payer: Self-pay | Admitting: Family

## 2016-02-06 ENCOUNTER — Ambulatory Visit (HOSPITAL_BASED_OUTPATIENT_CLINIC_OR_DEPARTMENT_OTHER)
Admission: RE | Admit: 2016-02-06 | Discharge: 2016-02-06 | Disposition: A | Discharge status: Home / Self Care | Payer: BLUE CROSS/BLUE SHIELD | Source: Ambulatory Visit | Attending: Family | Admitting: Family

## 2016-02-06 DIAGNOSIS — Z1231 Encounter for screening mammogram for malignant neoplasm of breast: Secondary | ICD-10-CM | POA: Diagnosis present

## 2016-02-06 DIAGNOSIS — Z0000 Encounter for general adult medical examination without abnormal findings: Secondary | ICD-10-CM

## 2016-02-07 NOTE — Telephone Encounter (Signed)
Last OV: 12/15/15 Last cyclobenzaprine Rf: 07/04/15, #90 x 1. Next OV: none scheduled.  Please advise request?

## 2016-02-10 ENCOUNTER — Telehealth: Payer: Self-pay | Admitting: *Deleted

## 2016-02-10 MED ORDER — ALPRAZOLAM 0.25 MG PO TABS: 0.2500 mg | ORAL_TABLET | Freq: Two times a day (BID) | ORAL | 0 refills | Status: DC | PRN

## 2016-02-10 NOTE — Telephone Encounter (Signed)
Rx faxed to pharmacy at 4:30pm.

## 2016-02-10 NOTE — Telephone Encounter (Signed)
Received alprazolam request from [place].  Last RF: 12/14/15, #60 UDS: 12/14/15, moderate and due 03/13/16 Last OV: 12/14/15 Next OV:  None scheduled.  Rx printed and forwarded to PCP for signature.

## 2016-02-15 ENCOUNTER — Ambulatory Visit (HOSPITAL_BASED_OUTPATIENT_CLINIC_OR_DEPARTMENT_OTHER): Payer: BLUE CROSS/BLUE SHIELD

## 2016-03-20 ENCOUNTER — Telehealth: Payer: Self-pay | Admitting: Family

## 2016-03-20 MED ORDER — ALPRAZOLAM 0.25 MG PO TABS: 0.2500 mg | ORAL_TABLET | Freq: Two times a day (BID) | ORAL | 0 refills | Status: DC | PRN

## 2016-03-20 NOTE — Telephone Encounter (Signed)
Relation to YO:MAYO Call back number:[phone] Pharmacy: [place], [place] - [address] AT [place]  Reason for call:  Patient requesting a refill ALPRAZolam (XANAX) 0.25 MG tablet, patient states pharmacy sent 3 faxes requesting medication and patient is completely out, please advise

## 2016-03-20 NOTE — Telephone Encounter (Signed)
Melissa-- pt was last seen in December and has not future appts scheduled.  When should pt follow up in the office?  Last RX: 02/10/16, #60 Last OV: 12/14/15 Next OV:  None scheduled UDS: 12/14/15 and due 03/13/16.  Rx printed and forwarded to PCP for signature.

## 2016-04-18 ENCOUNTER — Telehealth: Payer: Self-pay | Admitting: Family

## 2016-04-19 NOTE — Telephone Encounter (Signed)
Caller name: Relationship to patient: Self Can be reached: [phone]  Pharmacy:  [facility], [place] - [address] AT [place] [phone] (Phone) [phone] (Fax)    Reason for call: Patient ALPRAZolam (XANAX) 0.25 MG tablet  Needs PA

## 2016-04-20 MED ORDER — ALPRAZOLAM 0.25 MG PO TABS: 0.2500 mg | ORAL_TABLET | Freq: Two times a day (BID) | ORAL | 0 refills | Status: AC | PRN

## 2016-04-20 NOTE — Telephone Encounter (Signed)
I would like her to pick up rx at her convenience and she will need to give UDS at that time.  This is a controlled substance and we have protocols that we need to follow for all patients in order to safely prescribe them.

## 2016-04-20 NOTE — Telephone Encounter (Signed)
Notified pt. She became very upset. States she understands protocols are needed but feels this frequency of UDS is abusive for pt's that are not abusing their medication and keeping appointments as advised. Pt states she is going to do without this medication for now and is going to investigate this [employer] further. Rx shredded.  Message forwarded to clinical supervisor.

## 2016-04-20 NOTE — Telephone Encounter (Signed)
Noted  

## 2016-04-20 NOTE — Addendum Note (Signed)
Addended by: Kelle Darting A on: 04/20/2016 10:11 AM   Modules accepted: Orders

## 2016-04-20 NOTE — Telephone Encounter (Signed)
Needs 6 month follow up please.  

## 2016-04-20 NOTE — Telephone Encounter (Signed)
Notified pt that Rx is ready for pick up and we will need her to provide UDS sample. Pt became agitated and states she just completed drug screen in December and doesn't see why she need a repeat screen this soon. States she was told she could follow up in the office every 6 months and provide UDS at those visit. Thought [facility] stated it would be every 6 months. Advised pt that contract states UDS will be random / periodic and every 6 months is just for the office visit. Pt states this is very inconvenient for her and she is getting ready to go out of town and is unable to get Rx today. Pt very upset about this impact on her. States we can either fill Rx or not but she would like return call from PCP. Please advise?

## 2016-04-20 NOTE — Telephone Encounter (Addendum)
Last RX: 03/20/16 Last OV:  12/14/15 Next OV: none scheduled UDS: 12/2015 moderate and due 03/2016.  Rx printed and forwarded to PCP for signature. Pt will need to pick up Rx and provide UDS at that time.  Please advise when pt should follow up in the office?

## 2016-07-06 ENCOUNTER — Other Ambulatory Visit: Payer: Self-pay | Admitting: Family

## 2016-09-26 ENCOUNTER — Other Ambulatory Visit: Payer: Self-pay | Admitting: Family

## 2016-09-26 NOTE — Telephone Encounter (Signed)
90 day supply of amlodipine and omeprazole sent to mail order. Pt was due for 6 month follow up in June and is past due. Letter mailed to pt. Please advise flexeril refill request?

## 2016-10-16 ENCOUNTER — Other Ambulatory Visit: Payer: Self-pay | Admitting: Family

## 2016-10-16 NOTE — Telephone Encounter (Signed)
Requesting Zoloft 100mg -Take 1 tablet daily Last refill:04/18/16;#90,1 Last OV:12/14/15 Please advise.//AB/CMA

## 2016-12-11 ENCOUNTER — Ambulatory Visit (INDEPENDENT_AMBULATORY_CARE_PROVIDER_SITE_OTHER): Payer: BLUE CROSS/BLUE SHIELD

## 2016-12-11 DIAGNOSIS — Z23 Encounter for immunization: Secondary | ICD-10-CM

## 2017-10-31 ENCOUNTER — Ambulatory Visit (INDEPENDENT_AMBULATORY_CARE_PROVIDER_SITE_OTHER): Payer: BLUE CROSS/BLUE SHIELD

## 2017-10-31 DIAGNOSIS — Z23 Encounter for immunization: Secondary | ICD-10-CM | POA: Diagnosis not present
